# Patient Record
Sex: Male | Born: 1955
Health system: Southern US, Community
[De-identification: ages and names within clinical notes are randomized; demographics above are authoritative.]

## PROBLEM LIST (undated history)

## (undated) DIAGNOSIS — R569 Unspecified convulsions: Secondary | ICD-10-CM

## (undated) DIAGNOSIS — I10 Essential (primary) hypertension: Secondary | ICD-10-CM

## (undated) DIAGNOSIS — Z720 Tobacco use: Secondary | ICD-10-CM

## (undated) DIAGNOSIS — C3492 Malignant neoplasm of unspecified part of left bronchus or lung: Principal | ICD-10-CM

## (undated) DIAGNOSIS — D170 Benign lipomatous neoplasm of skin and subcutaneous tissue of head, face and neck: Secondary | ICD-10-CM

## (undated) HISTORY — DX: Malignant neoplasm of unspecified part of left bronchus or lung: C34.92

## (undated) HISTORY — DX: Benign lipomatous neoplasm of skin and subcutaneous tissue of head, face and neck: D17.0

## (undated) HISTORY — PX: NO PAST SURGERIES: SHX2092

## (undated) HISTORY — DX: Tobacco use: Z72.0

---

## 2002-12-31 ENCOUNTER — Emergency Department (HOSPITAL_COMMUNITY): Admission: EM | Admit: 2002-12-31 | Discharge: 2002-12-31 | Payer: Self-pay

## 2003-02-02 ENCOUNTER — Emergency Department (HOSPITAL_COMMUNITY): Admission: EM | Admit: 2003-02-02 | Discharge: 2003-02-02 | Payer: Self-pay | Admitting: Emergency Medicine

## 2003-04-22 ENCOUNTER — Emergency Department (HOSPITAL_COMMUNITY): Admission: EM | Admit: 2003-04-22 | Discharge: 2003-04-23 | Payer: Self-pay | Admitting: Emergency Medicine

## 2009-01-19 ENCOUNTER — Emergency Department (HOSPITAL_COMMUNITY): Admission: EM | Admit: 2009-01-19 | Discharge: 2009-01-19 | Payer: Self-pay | Admitting: Family Medicine

## 2010-10-03 LAB — POCT I-STAT, CHEM 8
Calcium, Ion: 1.08 mmol/L — ABNORMAL LOW (ref 1.12–1.32)
Chloride: 108 mEq/L (ref 96–112)
HCT: 49 % (ref 39.0–52.0)
Hemoglobin: 16.7 g/dL (ref 13.0–17.0)
Potassium: 4.1 mEq/L (ref 3.5–5.1)

## 2010-10-03 LAB — CK: Total CK: 347 U/L — ABNORMAL HIGH (ref 7–232)

## 2012-01-28 ENCOUNTER — Emergency Department (HOSPITAL_COMMUNITY)
Admission: EM | Admit: 2012-01-28 | Discharge: 2012-01-28 | Disposition: A | Discharge status: Home / Self Care | Payer: Self-pay | Attending: Emergency Medicine | Admitting: Emergency Medicine

## 2012-01-28 ENCOUNTER — Encounter (HOSPITAL_COMMUNITY): Payer: Self-pay | Admitting: Emergency Medicine

## 2012-01-28 DIAGNOSIS — F17200 Nicotine dependence, unspecified, uncomplicated: Secondary | ICD-10-CM | POA: Insufficient documentation

## 2012-01-28 DIAGNOSIS — T63461A Toxic effect of venom of wasps, accidental (unintentional), initial encounter: Secondary | ICD-10-CM | POA: Insufficient documentation

## 2012-01-28 DIAGNOSIS — T7840XA Allergy, unspecified, initial encounter: Secondary | ICD-10-CM

## 2012-01-28 DIAGNOSIS — T6391XA Toxic effect of contact with unspecified venomous animal, accidental (unintentional), initial encounter: Secondary | ICD-10-CM | POA: Insufficient documentation

## 2012-01-28 MED ORDER — METHYLPREDNISOLONE SODIUM SUCC 125 MG IJ SOLR
125.0000 mg | Freq: Once | INTRAMUSCULAR | Status: AC
  Administered 2012-01-28: 125 mg via INTRAVENOUS
  Filled 2012-01-28: qty 2

## 2012-01-28 MED ORDER — FAMOTIDINE IN NACL 20-0.9 MG/50ML-% IV SOLN
20.0000 mg | Freq: Once | INTRAVENOUS | Status: AC
  Administered 2012-01-28: 20 mg via INTRAVENOUS
  Filled 2012-01-28: qty 50

## 2012-01-28 NOTE — ED Notes (Signed)
Per EMS: Was stung on the lip by a bee one time. No hxt of meds or allergies. Onset of n/v within of intal sting. No resp distress.  Initial SpO2 87%. Breath sounds clear. 25mg  Benadryl IM given by PETAR.  25mg   Benadryl PO in route by [facility]. Pt. Is Spanish speaking. Family at bedside who speak [place].

## 2012-01-28 NOTE — ED Provider Notes (Signed)
History     CSN: [id number]  Arrival date & time 01/28/12  1955   First MD Initiated Contact with Patient 01/28/12 2002      No chief complaint on file.   (Consider location/radiation/quality/duration/timing/severity/associated sxs/prior treatment) HPI Comments: Patient presents today after he was stung by a bee on the lip about 2-3 hours prior to arrival in the ED.  He reports initially he felt pain, but then he became short of breath and broke out in an itchy rash on his abdomen.   He also had a couple episodes of vomiting after the bee sting.  He was given 25 mg Benadryl IM by PETAR initially and then given 25mg  PO by EMS prior to arrival in the ED.  He reports that his symptoms then improved.  He denies any SOB at this time. He denies swelling of his lips, tongue, or throat.  No prior allergic reaction to bees.    Patient is a 56 y.o. male presenting with allergic reaction. The history is provided by the patient. The history is limited by a language barrier. A language interpreter was used.  Allergic Reaction The primary symptoms are  shortness of breath, nausea, vomiting, rash and urticaria. The primary symptoms do not include wheezing or dizziness.    History reviewed. No pertinent past medical history.  History reviewed. No pertinent past surgical history.  History reviewed. No pertinent family history.  History  Substance Use Topics  . Smoking status: Current Everyday Smoker -- 1.0 packs/day for 50 years  . Smokeless tobacco: Not on file  . Alcohol Use: 7.2 oz/week    12 Cans of beer per week      Review of Systems  Constitutional: Negative for fever and chills.  HENT: Negative for facial swelling, trouble swallowing and voice change.   Respiratory: Positive for chest tightness and shortness of breath. Negative for wheezing.   Cardiovascular: Negative for chest pain.  Gastrointestinal: Positive for nausea and vomiting.  Skin: Positive for rash.  Neurological:  Negative for dizziness, syncope and light-headedness.    Allergies  Review of patient's allergies indicates no known allergies.  Home Medications  No current outpatient prescriptions on file.  BP 154/75  Pulse 89  Temp 97.4 F (36.3 C) (Oral)  Resp 20  SpO2 100%  Physical Exam  Nursing note and vitals reviewed. Constitutional: He is oriented to person, place, and time. He appears well-developed and well-nourished. No distress.  HENT:  Head: Normocephalic and atraumatic.  Mouth/Throat: Oropharynx is clear and moist and mucous membranes are normal.       No sign of airway obstruction. No edema of face, eyelids, lips, tongue, uvula.Marland Kitchen Uvula midline, no nasal congestion or drooling.  Tongue not elevated. No trismus.  Neck: Trachea normal, normal range of motion and full passive range of motion without pain. Neck supple. No tracheal deviation present.  Cardiovascular: Normal rate, regular rhythm, intact distal pulses and normal pulses.        Not tachycardic  Pulmonary/Chest: Effort normal. No stridor.  Musculoskeletal: Normal range of motion.  Neurological: He is alert and oriented to person, place, and time.  Skin: Skin is warm and intact. He is not diaphoretic.       No rash at this time.  Psychiatric: He has a normal mood and affect. His behavior is normal.    ED Course  Procedures (including critical care time)  Labs Reviewed - No data to display No results found.   No diagnosis found.  MDM  Patient re-evaluated prior to dc, is hemodynamically stable, in no respiratory distress, and denies the feeling of throat closing. Pt has been advised to take OTC benadryl & return to the ED if they have a mod-severe allergic rxn (s/s including throat closing, difficulty breathing, swelling of lips face or tongue). Pt is to follow up with their PCP. Pt is agreeable with plan & verbalizes understanding.        Pascal Lux [place], PA-C 01/29/12 1247

## 2012-01-29 NOTE — ED Provider Notes (Signed)
Medical screening examination/treatment/procedure(s) were conducted as a shared visit with non-physician practitioner(s) and myself.  I personally evaluated the patient during the encounter   David Goswick, MD 01/29/12 1505 

## 2013-12-09 ENCOUNTER — Emergency Department (HOSPITAL_COMMUNITY)
Admission: EM | Admit: 2013-12-09 | Discharge: 2013-12-09 | Discharge status: Against Medical Advice | Payer: Self-pay | Attending: Emergency Medicine | Admitting: Emergency Medicine

## 2013-12-09 ENCOUNTER — Encounter (HOSPITAL_COMMUNITY): Payer: Self-pay | Admitting: Emergency Medicine

## 2013-12-09 DIAGNOSIS — R221 Localized swelling, mass and lump, neck: Principal | ICD-10-CM

## 2013-12-09 DIAGNOSIS — F17200 Nicotine dependence, unspecified, uncomplicated: Secondary | ICD-10-CM | POA: Insufficient documentation

## 2013-12-09 DIAGNOSIS — M542 Cervicalgia: Secondary | ICD-10-CM | POA: Insufficient documentation

## 2013-12-09 DIAGNOSIS — R220 Localized swelling, mass and lump, head: Secondary | ICD-10-CM | POA: Insufficient documentation

## 2013-12-09 NOTE — ED Notes (Signed)
Patient approached desk and announced that he was leaving. Declined to speak with nurse as requested by registration personnel.

## 2013-12-09 NOTE — ED Notes (Signed)
Pt. reports lump at back of lower neck onset December last year increasing in size with intermittent pain , denies injury , no drainage .

## 2014-01-31 ENCOUNTER — Encounter (HOSPITAL_COMMUNITY): Payer: Self-pay | Admitting: Emergency Medicine

## 2014-01-31 ENCOUNTER — Emergency Department (HOSPITAL_COMMUNITY): Payer: Self-pay

## 2014-01-31 ENCOUNTER — Emergency Department (HOSPITAL_COMMUNITY)
Admission: EM | Admit: 2014-01-31 | Discharge: 2014-01-31 | Disposition: A | Discharge status: Home / Self Care | Payer: Self-pay | Attending: Emergency Medicine | Admitting: Emergency Medicine

## 2014-01-31 DIAGNOSIS — D1779 Benign lipomatous neoplasm of other sites: Secondary | ICD-10-CM | POA: Insufficient documentation

## 2014-01-31 DIAGNOSIS — F17200 Nicotine dependence, unspecified, uncomplicated: Secondary | ICD-10-CM | POA: Insufficient documentation

## 2014-01-31 DIAGNOSIS — M542 Cervicalgia: Secondary | ICD-10-CM | POA: Insufficient documentation

## 2014-01-31 DIAGNOSIS — D179 Benign lipomatous neoplasm, unspecified: Secondary | ICD-10-CM

## 2014-01-31 LAB — CBC WITH DIFFERENTIAL/PLATELET
Basophils Absolute: 0 10*3/uL (ref 0.0–0.1)
Basophils Relative: 0 % (ref 0–1)
Eosinophils Absolute: 0.1 10*3/uL (ref 0.0–0.7)
Eosinophils Relative: 1 % (ref 0–5)
HCT: 46.3 % (ref 39.0–52.0)
Hemoglobin: 16.7 g/dL (ref 13.0–17.0)
Lymphocytes Relative: 14 % (ref 12–46)
Lymphs Abs: 1.2 10*3/uL (ref 0.7–4.0)
MCH: 33.9 pg (ref 26.0–34.0)
MCHC: 36.1 g/dL — ABNORMAL HIGH (ref 30.0–36.0)
MCV: 93.9 fL (ref 78.0–100.0)
Monocytes Absolute: 1.1 10*3/uL — ABNORMAL HIGH (ref 0.1–1.0)
Monocytes Relative: 13 % — ABNORMAL HIGH (ref 3–12)
Neutro Abs: 6.1 10*3/uL (ref 1.7–7.7)
Neutrophils Relative %: 72 % (ref 43–77)
Platelets: 295 10*3/uL (ref 150–400)
RBC: 4.93 MIL/uL (ref 4.22–5.81)
RDW: 12.2 % (ref 11.5–15.5)
WBC: 8.5 10*3/uL (ref 4.0–10.5)

## 2014-01-31 LAB — BASIC METABOLIC PANEL
Anion gap: 16 — ABNORMAL HIGH (ref 5–15)
BUN: 4 mg/dL — ABNORMAL LOW (ref 6–23)
CO2: 22 mEq/L (ref 19–32)
Calcium: 9.3 mg/dL (ref 8.4–10.5)
Chloride: 97 mEq/L (ref 96–112)
Creatinine, Ser: 0.48 mg/dL — ABNORMAL LOW (ref 0.50–1.35)
GFR calc Af Amer: >90 mL/min (ref >90)
GFR calc non Af Amer: >90 mL/min (ref >90)
Glucose, Bld: 92 mg/dL (ref 70–99)
Potassium: 4.2 mEq/L (ref 3.7–5.3)
Sodium: 135 mEq/L — ABNORMAL LOW (ref 137–147)

## 2014-01-31 MED ORDER — SODIUM CHLORIDE 0.9 % IV SOLN
Freq: Once | INTRAVENOUS | Status: AC
  Administered 2014-01-31: 13:00:00 via INTRAVENOUS

## 2014-01-31 MED ORDER — IOHEXOL 300 MG/ML  SOLN
100.0000 mL | Freq: Once | INTRAMUSCULAR | Status: AC | PRN
  Administered 2014-01-31: 100 mL via INTRAVENOUS

## 2014-01-31 NOTE — ED Notes (Addendum)
Pt reports having a growth on the back of his neck since December, which has progressively gotten worse. Pt also reports having another area, which is smaller on the left side of the anterior neck. Pt reports the pain has worsened over the past week. Pt is hypertensive (201/84) in triage, pt denies a history of hypertension. Pt reports feeling dizzy, however denies blurred vision.

## 2014-01-31 NOTE — ED Provider Notes (Signed)
CSN: [id number]     Arrival date & time 01/31/14  1120 History   First MD Initiated Contact with Patient 01/31/14 1140     Chief Complaint  Patient presents with  . Neck Pain      HPI  Pt presents with his family with a complaint of a mass on the back of his neck, and on his left neck. He been present since December. Family feels they're getting bigger. He denies any trouble swallowing or breathing. No pain with movement of his. They're soft. Cannot read. They're not draining. No injuries to the area. No additional symptoms. No swelling in his groin, axilla. No additional areas of swelling  History reviewed. No pertinent past medical history. History reviewed. No pertinent past surgical history. No family history on file. History  Substance Use Topics  . Smoking status: Current Every Day Smoker -- 1.00 packs/day for 35 years    Types: Cigarettes  . Smokeless tobacco: Never Used  . Alcohol Use: Yes    Review of Systems  Constitutional: Negative for fever, chills, diaphoresis, appetite change and fatigue.  HENT: Negative for mouth sores, sore throat and trouble swallowing.        Swelling in the posterior neck and lateral neck  Eyes: Negative for visual disturbance.  Respiratory: Negative for cough, chest tightness, shortness of breath and wheezing.   Cardiovascular: Negative for chest pain.  Gastrointestinal: Negative for nausea, vomiting, abdominal pain, diarrhea and abdominal distention.  Endocrine: Negative for polydipsia, polyphagia and polyuria.  Genitourinary: Negative for dysuria, frequency and hematuria.  Musculoskeletal: Negative for gait problem.  Skin: Negative for color change, pallor and rash.  Neurological: Negative for dizziness, syncope, light-headedness and headaches.  Hematological: Does not bruise/bleed easily.  Psychiatric/Behavioral: Negative for behavioral problems and confusion.      Allergies  Bee venom  Home Medications   Prior to Admission  medications   Not on File   BP 193/70  Pulse 89  Temp(Src) 98.2 F (36.8 C) (Oral)  Resp 18  SpO2 94% Physical Exam  Constitutional: He is oriented to person, place, and time. He appears well-developed and well-nourished. No distress.  HENT:  Head: Normocephalic.  Eyes: Conjunctivae are normal. Pupils are equal, round, and reactive to light. No scleral icterus.  Neck: Normal range of motion. Neck supple. No thyromegaly present.    Cardiovascular: Normal rate and regular rhythm.  Exam reveals no gallop and no friction rub.   No murmur heard. Pulmonary/Chest: Effort normal and breath sounds normal. No respiratory distress. He has no wheezes. He has no rales.  Abdominal: Soft. Bowel sounds are normal. He exhibits no distension. There is no tenderness. There is no rebound.  Musculoskeletal: Normal range of motion.  Neurological: He is alert and oriented to person, place, and time.  Skin: Skin is warm and dry. No rash noted.  Psychiatric: He has a normal mood and affect. His behavior is normal.    ED Course  Procedures (including critical care time) Labs Review Labs Reviewed  BASIC METABOLIC PANEL - Abnormal; Notable for the following:    Sodium 135 (*)    BUN 4 (*)    Creatinine, Ser 0.48 (*)    Anion gap 16 (*)    All other components within normal limits  CBC WITH DIFFERENTIAL - Abnormal; Notable for the following:    MCHC 36.1 (*)    Monocytes Relative 13 (*)    Monocytes Absolute 1.1 (*)    All other components within normal limits  Imaging Review Ct Soft Tissue Neck W Contrast  01/31/2014   CLINICAL DATA:  Posterior and left lateral neck mass/swelling since 05/2013. Increasing posterior neck pain for the past week.  EXAM: CT NECK WITH CONTRAST  TECHNIQUE: Multidetector CT imaging of the neck was performed using the standard protocol following the bolus administration of intravenous contrast.  CONTRAST:  13mL OMNIPAQUE IOHEXOL 300 MG/ML  SOLN  COMPARISON:  None.   FINDINGS: The visualized portion of brain is unremarkable. Visualized paranasal sinuses and mastoid air cells are clear. The nasopharynx, oropharynx, oral cavity, and larynx are unremarkable. 1.1 cm low-density left thyroid nodule is incidentally noted. The submandibular glands are unremarkable. Punctate calcifications are noted in the parotid glands.  There is mild subcutaneous fat stranding throughout the posterior neck. Immediately deep to a skin marker placed on the posterior neck is a more circumscribed focus of fat density measuring 3.9 x 2.2 cm, likely representing a lipoma. No fluid collection or enhancing mass is identified.  No enlarged lymph nodes are identified in the neck. Moderate atherosclerotic vascular calcification is noted of the common carotid arteries aortic arch/arch vessel origins. There is a 10 mm nodule in the apical left upper lobe. A 3 mm nodule is present in the apical right upper lobe. Moderate disc space narrowing and prominent bridging osteophytes are present in the mid and lower cervical spine.  IMPRESSION: Mild, diffuse subcutaneous fat stranding/ edema throughout the posterior neck without fluid collection or enhancing mass. 3.9 cm oval fat density structure in the posterior neck is compatible with a lipoma.   Electronically Signed   By: Logan Bores   On: 01/31/2014 14:05     EKG Interpretation None      MDM   Final diagnoses:  Lipoma    ENT follow up if consideration for elective resection.    Tanna Furry, MD 01/31/14 1450

## 2014-01-31 NOTE — Discharge Instructions (Signed)
Lipoma (Lipoma) Un lipoma es un tumor benigno (no canceroso) compuesto por clulas adiposas. Generalmente se encuentran debajo de la piel (subcutneos). Puede producirse en cualquier tejido del cuerpo que contenga grasa. Las zonas ms frecuentes en las que aparecen son la espalda, los hombros, las nalgas y los muslos.Los lipomas son un crecimiento comn en los tejidos blandos. Son blandos y de crecimiento lento. La mayor parte de los problemas que ocasiona un lipoma dependen de la ubicacin del tumor (bulto) de tejido adiposo. DIAGNSTICO Generalmente el diagnstico se realiza a travs del examen fsico. Aunque estos tumores rara vez se vuelven cancerosos, las radiografas podrn determinar de qu tipo se trata. Los estudios que se utilizan incluyen:  Tomografas computadas.  Resonancia magntica (RMN). TRATAMIENTO Los lipomas pequeos, que no causan problemas, pueden observarse. Si un lipoma se agranda mucho o causa problemas, el mejor tratamiento ser la extirpacin. Tambin se extirpan con fines estticos. En la [place] se extirpan las clulas de grasa y la cpsula que las rodea. Generalmente se lleva a cabo aplicando medicamentos que adormecen la zona (anestsico local). El tejido extrado es examinado en el microscopio para asegurarse que no es canceroso. Cumpla con las citas tal como se le indic. SOLICITE ATENCIN MDICA SI:  El lipoma se agranda o se hace ms duro.  Se vuelve rojo, se hincha cada vez ms o duele. Podran ser signos de infeccin o de un problema ms grave. Document Released: 03/23/2005 Document Revised: 09/05/2011 [facility] Patient Information 2015 [place]. This information is not intended to replace advice given to you by your health care provider. Make sure you discuss any questions you have with your health care provider.

## 2014-11-25 ENCOUNTER — Emergency Department (HOSPITAL_COMMUNITY)
Admission: EM | Admit: 2014-11-25 | Discharge: 2014-11-25 | Disposition: A | Discharge status: Home / Self Care | Payer: Self-pay | Attending: Emergency Medicine | Admitting: Emergency Medicine

## 2014-11-25 ENCOUNTER — Encounter (HOSPITAL_COMMUNITY): Payer: Self-pay | Admitting: Emergency Medicine

## 2014-11-25 ENCOUNTER — Emergency Department (HOSPITAL_COMMUNITY): Payer: Self-pay

## 2014-11-25 DIAGNOSIS — S52692A Other fracture of lower end of left ulna, initial encounter for closed fracture: Secondary | ICD-10-CM | POA: Insufficient documentation

## 2014-11-25 DIAGNOSIS — Y9289 Other specified places as the place of occurrence of the external cause: Secondary | ICD-10-CM | POA: Insufficient documentation

## 2014-11-25 DIAGNOSIS — W1839XA Other fall on same level, initial encounter: Secondary | ICD-10-CM | POA: Insufficient documentation

## 2014-11-25 DIAGNOSIS — S52202A Unspecified fracture of shaft of left ulna, initial encounter for closed fracture: Secondary | ICD-10-CM

## 2014-11-25 DIAGNOSIS — Z79899 Other long term (current) drug therapy: Secondary | ICD-10-CM | POA: Insufficient documentation

## 2014-11-25 DIAGNOSIS — Z720 Tobacco use: Secondary | ICD-10-CM | POA: Insufficient documentation

## 2014-11-25 DIAGNOSIS — Y998 Other external cause status: Secondary | ICD-10-CM | POA: Insufficient documentation

## 2014-11-25 DIAGNOSIS — Y9389 Activity, other specified: Secondary | ICD-10-CM | POA: Insufficient documentation

## 2014-11-25 DIAGNOSIS — S52592A Other fractures of lower end of left radius, initial encounter for closed fracture: Secondary | ICD-10-CM | POA: Insufficient documentation

## 2014-11-25 DIAGNOSIS — S52502A Unspecified fracture of the lower end of left radius, initial encounter for closed fracture: Secondary | ICD-10-CM

## 2014-11-25 DIAGNOSIS — I10 Essential (primary) hypertension: Secondary | ICD-10-CM | POA: Insufficient documentation

## 2014-11-25 HISTORY — DX: Essential (primary) hypertension: I10

## 2014-11-25 MED ORDER — HYDROCODONE-ACETAMINOPHEN 5-325 MG PO TABS
1.0000 | ORAL_TABLET | Freq: Four times a day (QID) | ORAL | Status: DC | PRN
Start: 2014-11-25 — End: 2015-10-23

## 2014-11-25 MED ORDER — HYDROCODONE-ACETAMINOPHEN 5-325 MG PO TABS: 1.0000 | ORAL_TABLET | Freq: Once | ORAL | Status: DC

## 2014-11-25 NOTE — Discharge Instructions (Signed)
Return to the emergency room with worsening of symptoms, new symptoms or with symptoms that are concerning, especially warmth, redness, worsening swelling, numbness, tingling, weakness. RICE: Rest, Ice (three cycles of 20 mins on, 58mns off at least twice a day), compression/brace, elevation. Heating pad works well for back pain. Ibuprofen '400mg'$  (2 tablets '200mg'$ ) every 5-6 hours for 3-5 days. norco for severe pain. Do not operate machinery, drive or drink alcohol while taking narcotics or muscle relaxers. Call to make follow up appointment with orthopedics as soon as possible.  Cuidados del yeso o la frula (Cast or Splint Care) El yeso y las frulas sostienen los miembros lesionados y evitan que los huesos se muevan hasta que se curen. Es importante que cuide el yeso o la frula cuando se encuentre en su casa.  INSTRUCCIONES PARA EL CUIDADO EN EL HOGAR  Mantenga el yeso o la frula al descubierto durante el tiempo de secado. Puede tardar eLyndal Pulley24 y 415horas para secarse si est hecho de yeso. La fibra de vidrio se seca en menos de 1 hora.  No apoye el yeso sobre nada que sea ms duro que una almohada durante 24 horas.  No aplique peso sobre el miembro lesionado ni haga presin sobre el yeso hasta que el mdico lo autorice.  Mantenga el yeso o la frula secos. Al mojarse pueden perder la forma y podra ocurrir que no soporten el m[place] Un yeso mojado que ha perdido su forma puede presionar de mGeographical information systems officerpeligrosa en la piel al secarse. Adems, la piel mojada podra infectarse.  Cubra el yeso o la frula con una bolsa plstica cuando tome un bao o cuando salga al exterior en das de lluvia o nieve. Si el yeso est colocado sobre el tronco, deber baarse pasando una esponja por el cuerpo, hasta que se lo retiren.  Si el yeso se moja, squelo con una toalla o con un secador de cabello slo en posicin de aire fro.  Mantenga el yeso o la frula limpios. Si el yeso se ensucia, puede limpiarlo  con un pao hmedo.  No coloque objetos extraos duros o blandos debajo del yeso o cabestrillo, como algodn, papel higinico, locin o talco.  No se rasque la piel por debajo del molde con ningn objeto. Podra quedar adherido al yeso. Adems, el rascado puede causar una infeccin. Si siente picazn, use un secador de cabello con aire fro sNIKEzona que pica para aFederated Department Stores  No recorte ni quite el relleno acolchado que se encuentra debajo del yeso.  Ejercite todas las articulaciones que no estn inmovilizadas por el yeso o frula. Por ejemplo, si tiene un yeso largo de pierna, ejercite la articulacin de la cadera y los dedos de los pies. Si tiene un brazo eConocoPhillipso entablillado, ejercite el hombro, el codo, el pulgar y los dedos de la [place]  Eleve el brazo o la pierna sobre 1  2 almohadas durante los primeros 3 das para disminuir la hinchazn y eConservation officer, historic buildingsEs mejor si puede elevar cmodamente el yeso para que quede ms aNew Caledoniadel nivel del corazn. SOLICITE ATENCIN MDICA SI:   El yeso o la frula se quiebran.  Siente que el yeso o la frula estn muy apretados o muy flojos.  Tiene una picazn insoportable debajo del yeso.  El yeso se moja o tiene una zona blanda.  Siente un feo oSears Holdings Corporationproviene del interior del yGalena Park  Algn objeto se queda atascado bajo el yeso.  La piel que rodea  el yeso enrojece o se vuelve sensible.  Siente un dolor nuevo o el dolor que senta empeora luego de la aplicacin del yeso. SOLICITE ATENCIN MDICA DE INMEDIATO SI:   Observa un lquido que sale por el yeso.  No puede mover el dedo lesionado.  Los dedos le cambian de color (blancos o azules), siente fro, Social research officer, government o por fuera del yeso los dedos estn muy inflamados.  Siente hormigueo o adormecimiento alrededor de la zona de la lesin.  Siente un dolor o presin intensos debajo del yeso.  Presenta dificultad para respirar o Risk manager.  Siente dolor en el pecho. Document  Released: 06/13/2005 Document Revised: 04/03/2013 [facility] Patient Information 2015 [place], [place]. This information is not intended to replace advice given to you by your health care provider. Make sure you discuss any questions you have with your health care provider. Fractura de mueca (Wrist Fracture) La fractura de mueca es la rotura de uno de los huesos que la forman. La mueca est formada por ocho huesos pequeos en la palma de la mano (huesos carpianos) y [employer] largos que componen el Management consultant (radio) y (cbito).  CAUSAS   Un golpe directo en la mueca.  Cada sobre una mano extendida.  Traumatismos, como un accidente automovilstico o una cada. FACTORES DE RIESGO Los factores de riesgo de fractura de [place] incluyen:   Psychologist, prison and probation services deportes de contacto y de alto riesgo, como esqu, ciclismo y patinaje sobre hielo.  Tomar corticoides.  Fumar.  Ser mujer.  Ser de Science writer.  Consumir ms de tres bebidas alcohlicas por da.  Tener baja densidad sea o disminucin de la densidad sea (osteoporosis u osteopenia).  La edad. En los [employer], la densidad sea [place].  Las [employer].  Antecedentes de fracturas previas. SIGNOS Y SNTOMAS Los sntomas de una fractura de mueca incluyen dolor con la palpacin, hematomas e hinchazn. Adems, la mueca puede colgar en una posicin extraa o verse deformada.  DIAGNSTICO El diagnstico puede incluir:  Examen fsico.  Radiografas. [place] de muchos factores, por ejemplo el tipo y la ubicacin de la [place], su edad y su nivel de [place]. El tratamiento puede ser quirrgico o no quirrgico.  Tratamiento no quirrgico Le colocarn un yeso o una frula en la [place], si el hueso est en una posicin correcta. Si la fractura no est en una posicin correcta, tal vez sea necesario que el mdico corrija el desplazamiento antes de poner el yeso o la frula. Generalmente, el  yeso o la frula se usarn durante varias semanas.  Tratamiento quirrgico En algunos [employer] partes del hueso estn tan fuera de Environmental consultant que es necesario recurrir a Furniture conservator/restorer ciruga para Glass blower/designer un dispositivo que las mantenga unidas mientras se [place]. Segn el tipo de fractura, hay diferentes opciones para mantener el [employer] en su lugar mientras se consolida, por ejemplo un yeso y pernos de metal.  INSTRUCCIONES PARA EL CUIDADO EN EL HOGAR  Mantenga la mueca lesionada elevada y [employer] dedos tanto como pueda.  No ejerza presin sobre cualquier parte del yeso o la frula. Podra romperse.   Use una bolsa plstica para proteger el yeso o la frula cuando tome un bao o [place]. No sumerja el yeso o la frula en el agua.  Tome los medicamentos solamente como se lo haya indicado el mdico.  Mantenga el yeso o la frula limpios y secos. Si se mojan, se daan o de repente [employer], comunquese de  inmediato con el mdico.  No consuma ningn producto que contenga tabaco, como cigarrillos, tabaco de Higher education careers adviser o Psychologist, sport and exercise. El tabaco puede retardar la consolidacin de la fractura. Si necesita ayuda para dejar de fumar, consulte al mdico.  Concurra a todas las visitas de control como se lo haya indicado el mdico. Esto es importante.  Pregntele al mdico si debe tomar suplementos de calcio y vitaminasC yD para promover la consolidacin de Electrical engineer. SOLICITE ATENCIN MDICA SI:   El yeso o la frula se daan, se rompen o se mojan.  Tiene fiebre.  Tiene escalofros.  Siente un dolor fuerte y continuo o est ms hinchado que antes de que le colocaran el yeso. SOLICITE ATENCIN MDICA DE INMEDIATO SI:   [place] uas de la mano del brazo lesionado se tornan azules o grises, o estn fras o adormecidas.  Pierde la sensibilidad en los dedos de la mano del brazo lesionado. ASEGRESE DE QUE:  Comprende estas instrucciones.  Controlar su afeccin.  Recibir  ayuda de inmediato si no mejora o si empeora. Document Released: 03/23/2005 Document Revised: 10/28/2013 [facility] Patient Information 2015 [place]. This information is not intended to replace advice given to you by your health care provider. Make sure you discuss any questions you have with your health care provider.

## 2014-11-25 NOTE — ED Provider Notes (Signed)
CSN: [id number]     Arrival date & time 11/25/14  1636 History  This chart was scribed for a non-physician practitioner, Al Corpus, PA-C working with Lacretia Leigh, MD by Randa Evens, ED Scribe. This patient was seen in room WTR8/WTR8 and the patient's care was started at 6:29 PM.    Chief Complaint  Patient presents with  . Hand Pain   The history is provided by the patient. No language interpreter was used.   HPI Comments: David Warren is a 59 y.o. male who presents to the Emergency Department complaining of improving left hand pain onset 1 week prior after falling. Pt states that he has associated swelling and tingling in the 1st and 2nd digits. Pt states that he fell on to his outstretched hand. Pt has tried tylenol and Advil that provides temporary relief. Denies fever, chills, nausea or vomiting.     Past Medical History  Diagnosis Date  . Hypertension    History reviewed. No pertinent past surgical history. No family history on file. History  Substance Use Topics  . Smoking status: Current Every Day Smoker -- 1.00 packs/day for 50 years  . Smokeless tobacco: Not on file  . Alcohol Use: 7.2 oz/week    12 Cans of beer per week    Review of Systems  Constitutional: Negative for fever and chills.  Gastrointestinal: Negative for nausea and vomiting.  Musculoskeletal: Positive for joint swelling and arthralgias.     Allergies  Review of patient's allergies indicates no known allergies.  Home Medications   Prior to Admission medications   Medication Sig Start Date End Date Taking? Authorizing Provider  acetaminophen (TYLENOL) 500 MG tablet Take 1,000 mg by mouth every 6 (six) hours as needed for moderate pain (pain).   Yes Historical Provider, MD  ibuprofen (ADVIL,MOTRIN) 200 MG tablet Take 400 mg by mouth every 6 (six) hours as needed (pain).   Yes Historical Provider, MD  Pseudoephedrine-Ibuprofen 30-200 MG CAPS Take 400 mg by mouth daily as needed (pain).   Yes  Historical Provider, MD  HYDROcodone-acetaminophen (NORCO/VICODIN) 5-325 MG per tablet Take 1 tablet by mouth every 6 (six) hours as needed. 11/25/14   Al Corpus, PA-C   BP 159/70 mmHg  Pulse 80  Temp(Src) 98 F (36.7 C) (Oral)  Resp 18  SpO2 99%   Physical Exam  Constitutional: He appears well-developed and well-nourished. No distress.  HENT:  Head: Normocephalic and atraumatic.  Eyes: Conjunctivae are normal. Right eye exhibits no discharge. Left eye exhibits no discharge.  Pulmonary/Chest: Effort normal. No respiratory distress.  Musculoskeletal: He exhibits tenderness.  Obvious deformity to left wrist with swelling, pain with supination and pronation, full ROM of wrist without significant pain. Strength intact, 2 + radial pulses equal bilaterally, sensation intact, no tenting of skin.  Neurological: He is alert. Coordination normal.  Skin: He is not diaphoretic.  Psychiatric: He has a normal mood and affect. His behavior is normal.  Nursing note and vitals reviewed.   ED Course  Procedures (including critical care time) DIAGNOSTIC STUDIES: Oxygen Saturation is 99% on RA, normal by my interpretation.    COORDINATION OF CARE: 7:00 PM-Discussed treatment plan with pt at bedside and pt agreed to plan.     Labs Review Labs Reviewed - No data to display  Imaging Review No results found.   DG Hand Complete Left (Final result) Result time: 11/25/14 17:56:04   Final result by Rad Results In Interface (11/25/14 17:56:04)   Narrative:   CLINICAL DATA:  Tripped and fell landing on hand with pain, initial encounter  EXAM: LEFT HAND - COMPLETE 3+ VIEW  COMPARISON: None.  FINDINGS: Transverse fracture of the distal radius is noted. An avulsion fracture of the ulnar styloid is seen as well. Some impaction and posterior angulation at the fracture site is noted. The carpal bones and metacarpals are within normal limits. No other focal abnormality is  seen.  IMPRESSION: Distal radial and ulnar fractures as described.   Electronically Signed By: Inez Catalina M.D. On: 11/25/2014 17:56      EKG Interpretation None        MDM   Final diagnoses:  Distal radial fracture, left, closed, initial encounter  Left ulnar fracture, closed, initial encounter   Pt with left distal and radial fractures as above with obvious wrist deformity. Injury over one week ago. Neurovascularly intact. No indication for acute intervention. Pt placed in a sugar tong splint and to follow up with orthopedist. Pt pain managed in ED. Script for norco provided. Driving and sedation precautions provided.   Discussed return precautions with patient. Discussed all results and patient verbalizes understanding and agrees with plan.  Case has been discussed with Dr. Zenia Resides who agrees with the above plan and to discharge.   I personally performed the services described in this documentation, which was scribed in my presence. The recorded information has been reviewed and is accurate.    Al Corpus, PA-C 11/28/14 4081  Lacretia Leigh, MD 12/02/14 1140

## 2014-11-25 NOTE — ED Notes (Signed)
Per family fell lasrt Wed on left hand, having swelling and pain

## 2015-10-20 ENCOUNTER — Emergency Department (HOSPITAL_COMMUNITY): Payer: Self-pay

## 2015-10-20 ENCOUNTER — Encounter (HOSPITAL_COMMUNITY): Payer: Self-pay | Admitting: *Deleted

## 2015-10-20 ENCOUNTER — Emergency Department (HOSPITAL_COMMUNITY)
Admission: EM | Admit: 2015-10-20 | Discharge: 2015-10-20 | Disposition: A | Discharge status: Home / Self Care | Payer: Self-pay | Attending: Emergency Medicine | Admitting: Emergency Medicine

## 2015-10-20 DIAGNOSIS — F17200 Nicotine dependence, unspecified, uncomplicated: Secondary | ICD-10-CM | POA: Insufficient documentation

## 2015-10-20 DIAGNOSIS — R042 Hemoptysis: Secondary | ICD-10-CM | POA: Insufficient documentation

## 2015-10-20 DIAGNOSIS — R221 Localized swelling, mass and lump, neck: Secondary | ICD-10-CM | POA: Insufficient documentation

## 2015-10-20 DIAGNOSIS — I10 Essential (primary) hypertension: Secondary | ICD-10-CM | POA: Insufficient documentation

## 2015-10-20 LAB — COMPREHENSIVE METABOLIC PANEL
ALT: 49 U/L (ref 17–63)
ANION GAP: 13 (ref 5–15)
AST: 86 U/L — ABNORMAL HIGH (ref 15–41)
Albumin: 4.4 g/dL (ref 3.5–5.0)
Alkaline Phosphatase: 157 U/L — ABNORMAL HIGH (ref 38–126)
CHLORIDE: 93 mmol/L — AB (ref 101–111)
CO2: 22 mmol/L (ref 22–32)
Calcium: 9.4 mg/dL (ref 8.9–10.3)
Creatinine, Ser: 0.44 mg/dL — ABNORMAL LOW (ref 0.61–1.24)
Glucose, Bld: 86 mg/dL (ref 65–99)
POTASSIUM: 3.7 mmol/L (ref 3.5–5.1)
Sodium: 128 mmol/L — ABNORMAL LOW (ref 135–145)
TOTAL PROTEIN: 7.7 g/dL (ref 6.5–8.1)
Total Bilirubin: 1 mg/dL (ref 0.3–1.2)

## 2015-10-20 LAB — TYPE AND SCREEN
ABO/RH(D): AB POS
ANTIBODY SCREEN: NEGATIVE

## 2015-10-20 LAB — PROTIME-INR
INR: 0.92 (ref 0.00–1.49)
Prothrombin Time: 12.6 seconds (ref 11.6–15.2)

## 2015-10-20 LAB — CBC
HEMATOCRIT: 44.6 % (ref 39.0–52.0)
HEMOGLOBIN: 16.5 g/dL (ref 13.0–17.0)
MCH: 34.7 pg — ABNORMAL HIGH (ref 26.0–34.0)
MCHC: 37 g/dL — ABNORMAL HIGH (ref 30.0–36.0)
MCV: 93.9 fL (ref 78.0–100.0)
Platelets: 300 10*3/uL (ref 150–400)
RBC: 4.75 MIL/uL (ref 4.22–5.81)
RDW: 12.2 % (ref 11.5–15.5)
WBC: 10.8 10*3/uL — AB (ref 4.0–10.5)

## 2015-10-20 LAB — ABO/RH: ABO/RH(D): AB POS

## 2015-10-20 MED ORDER — IOPAMIDOL (ISOVUE-300) INJECTION 61%
75.0000 mL | Freq: Once | INTRAVENOUS | Status: AC | PRN
  Administered 2015-10-20: 75 mL via INTRAVENOUS

## 2015-10-20 MED ORDER — SODIUM CHLORIDE 0.9 % IV BOLUS (SEPSIS)
500.0000 mL | Freq: Once | INTRAVENOUS | Status: AC
  Administered 2015-10-20: 500 mL via INTRAVENOUS

## 2015-10-20 NOTE — Discharge Instructions (Signed)
Hemoptysis  (Hemoptysis) Hemoptisis, que significa eliminar sangre al toser, puede ser un signo de un problema menor o de una afeccin mdica grave. La sangre puede provenir de los pulmones o de las vas areas. Tambin puede provenir de un sangrado que se haya producido fuera de los pulmones o de las vas areas. La sangre puede drenar hacia la trquea durante una hemorragia nasal intensa o cuando vomita sangre desde el estmago. Debido a que la hemoptisis puede ser un signo de una afeccin grave, requiere Armed forces training and education officer. En algunas personas, nunca se identifica una causa definida de hemoptisis.  CAUSAS  La causa ms frecuente de hemoptisis es la bronquitis. Otras causas frecuentes son:   Un vaso sanguneo que se rompe debido a la tos o a una infeccin.   Una afeccin mdica que causa dao a los grandes conductos de aire (bronquiectasia).   Un cogulo de [employer] pulmones (embolia pulmonar).   Neumona.   Tuberculosis.   Aspiracin de un cuerpo extrao pequeo.   Cncer. En algunas personas, nunca se identifica una causa definida de hemoptisis.   INSTRUCCIONES PARA EL CUIDADO EN EL HOGAR   Tome slo medicamentos de venta libre o recetados, segn las indicaciones del mdico. No use antitusivos excepto que el mdico lo autorice.  Si su mdico le receta antibiticos, tmelos tal como le indic. Tmelos todos, aunque se sienta mejor.  No fume. Evite ser un fumador pasivo.  Concurra a las visitas de control, segn las indicaciones. SOLICITE ATENCIN MDICA DE INMEDIATO SI:   Elimina sangre con el moco durante ms de una semana.  Tiene una tos intensa o que [place], y en la que elimina [place].  Tiene tos con la que elimina sangre que aparece y desaparece con el tiempo.  Tiene problemas para respirar.   Vomita sangre.  Las heces son sanguinolentas o de color negro.  Siente dolor en el pecho.   Tiene transpiracin nocturna.  Sufre mareos o se desmaya.    Tiene fiebre o sntomas que persisten durante ms de 2 o 3 das.  Tiene fiebre y los sntomas empeoran de manera sbita. ASEGRESE DE QUE:   Comprende estas instrucciones.  Controlar su enfermedad.  Solicitar ayuda de inmediato si no mejora o si empeora.   Esta informacin no tiene Marine scientist el consejo del mdico. Asegrese de hacerle al mdico cualquier pregunta que tenga.   Document Released: 06/13/2005 Document Revised: 05/30/2012 Elsevier Interactive Patient Education [employer].

## 2015-10-20 NOTE — ED Notes (Signed)
Patient transported to CT 

## 2015-10-20 NOTE — ED Notes (Signed)
Called for patient.  Not found in room.

## 2015-10-20 NOTE — ED Notes (Signed)
Pt's daughter reports pt has been coughing up blood and blood clots x 2 weeks.  Reports feeling a "ball" in his throat.  Pt is a smoker, smokes 1ppd.  Pt denies any cp or SOB at this time but reports pain in his throat.  He also drinks alcohol 6-7 24oz beer daily.

## 2015-10-20 NOTE — Progress Notes (Addendum)
EDCM spoke to patient's family at bedside. Patient's family  confirms patient does not have a pcp or insurance living in [place].  [facility] provided patient with pamphlet to [facility], informed patient of services there.  EDCM also provided patient with list of pcps who accept self pay patients, list of discount pharmacies and websites needymeds.org and GoodRX.com for medication assistance, phone number to inquire about the orange card, phone number to inquire about Medici[employer], phone number to inquire about the [place], financial resources in the community such as local churches, salvation army, urban ministries, and dental assistance for uninsured patients.  Patient's family thankful for resources.  No further EDCM needs at this time.    Follow up appointment made for patient at [facility] for Oct 26 2015 at 330pm.  This information was placed on patient's AVS.  EDCM also provided patient's daughter with written information regarding appointment.  Patient's family thankful for services.  Discussed with EDRN.  No further EDCm needs at this time.

## 2015-10-20 NOTE — ED Provider Notes (Signed)
CSN: [id number]     Arrival date & time 10/20/15  1133 History   First MD Initiated Contact with Patient 10/20/15 1523     Chief Complaint  Patient presents with  . Hemoptysis     The history is provided by the patient. No language interpreter was used.   Eugine Bubb is a 60 y.o. male who presents to the Emergency Department complaining of hemoptysis.  History is provided by the patient and his daughter. The last 2 weeks he's had increased congestion. A week ago he had a large amount of hemoptysis with clots mixed in. Since that time he's been coughing up phlegm mixed with small amounts of blood. As that have been going on his neck since 2012. He felt like one of them on the front of his neck was very large and now it feels much smaller than before.  No fever, chest pain, shortness of breath. He hasn't anterior throat pain earlier that has since resolved. He is a smoker and a heavy drinker. Sxs are moderate, constant, waxing and waning.    Past Medical History  Diagnosis Date  . Hypertension    History reviewed. No pertinent past surgical history. No family history on file. Social History  Substance Use Topics  . Smoking status: Current Every Day Smoker -- 1.00 packs/day for 50 years  . Smokeless tobacco: None  . Alcohol Use: 7.2 oz/week    12 Cans of beer per week    Review of Systems  All other systems reviewed and are negative.     Allergies  Review of patient's allergies indicates no known allergies.  Home Medications   Prior to Admission medications   Medication Sig Start Date End Date Taking? Authorizing Provider  ibuprofen (ADVIL,MOTRIN) 200 MG tablet Take 400 mg by mouth every 6 (six) hours as needed (pain).   Yes Historical Provider, MD  HYDROcodone-acetaminophen (NORCO/VICODIN) 5-325 MG per tablet Take 1 tablet by mouth every 6 (six) hours as needed. Patient not taking: Reported on 10/20/2015 11/25/14   Al Corpus, PA-C   BP 176/87 mmHg  Pulse 87  Temp(Src)  97.7 F (36.5 C) (Oral)  Resp 16  Ht '5\' 3"'$  (1.6 m)  Wt 132 lb (59.875 kg)  BMI 23.39 kg/m2  SpO2 99% Physical Exam  Constitutional: He is oriented to person, place, and time. He appears well-developed and well-nourished.  HENT:  Head: Normocephalic and atraumatic.  Mouth/Throat: Oropharynx is clear and moist.  Neck:  Multiple soft masses around neck  Cardiovascular: Normal rate and regular rhythm.   No murmur heard. Pulmonary/Chest: Effort normal and breath sounds normal. No respiratory distress.  Abdominal: Soft. There is no tenderness. There is no rebound and no guarding.  Musculoskeletal: He exhibits no edema or tenderness.  Neurological: He is alert and oriented to person, place, and time.  Skin: Skin is warm and dry.  Psychiatric: He has a normal mood and affect. His behavior is normal.  Nursing note and vitals reviewed.   ED Course  Procedures (including critical care time) Labs Review Labs Reviewed  COMPREHENSIVE METABOLIC PANEL - Abnormal; Notable for the following:    Sodium 128 (*)    Chloride 93 (*)    BUN <5 (*)    Creatinine, Ser 0.44 (*)    AST 86 (*)    Alkaline Phosphatase 157 (*)    All other components within normal limits  CBC - Abnormal; Notable for the following:    WBC 10.8 (*)    [facility]  34.7 (*)    MCHC 37.0 (*)    All other components within normal limits  PROTIME-INR  QUANTIFERON TB GOLD ASSAY (BLOOD)  TYPE AND SCREEN  ABO/RH    Imaging Review Dg Chest 2 View  10/20/2015  CLINICAL DATA:  Hemoptysis for 2 weeks.  Initial encounter. EXAM: CHEST  2 VIEW COMPARISON:  None. FINDINGS: The chest is hyperexpanded with attenuation of the pulmonary vasculature. The lungs are clear. No pneumothorax or pleural effusion. No focal bony abnormality. IMPRESSION: COPD without acute disease. Electronically Signed   By: Inge Rise M.D.   On: 10/20/2015 13:14   Ct Soft Tissue Neck W Contrast  10/20/2015  CLINICAL DATA:  Neck swelling. Hemoptysis for 2  weeks. Abnormal sensation in throat. EXAM: CT NECK WITH CONTRAST TECHNIQUE: Multidetector CT imaging of the neck was performed using the standard protocol following the bolus administration of intravenous contrast. CONTRAST:  43m ISOVUE-300 IOPAMIDOL (ISOVUE-300) INJECTION 61% COMPARISON:  None. FINDINGS: Pharynx and larynx: Symmetric appearance of the pharyngeal soft tissues without mass identified. Unremarkable larynx. Multiple dental caries. Salivary glands: Unremarkable appearance of the submandibular glands. A few punctate parotid calcifications are noted bilaterally. No parotid mass or inflammatory change is seen. Thyroid: 1.3 cm hypodense nodule on the left thyroid lobe incidentally noted. Lymph nodes: No enlarged lymph nodes are identified in the neck. Vascular: Mild-to-moderate atherosclerotic calcification about the carotid bifurcations. Prominent arch vessel atherosclerosis partially visualized. Major vascular structures of the neck appear patent. Limited intracranial: The visualized portion the brain is unremarkable. Visualized orbits: Unremarkable. Mastoids and visualized paranasal sinuses: Clear. Skeleton: Moderate mid to lower cervical disc degeneration. Prominent subcutaneous fat throughout the posterior neck as well as upper anterior neck/submandibular region. Upper chest: 1.6 x 1.6 cm left apical lung lesion with the both cavitary and nodular soft tissue components. IMPRESSION: 1. No mass or acute abnormality identified in the neck. 2. 1.6 cm cavitary lesion in the left lung apex. This may reflect a primary lung carcinoma or be infectious. Consider chest CT to evaluate the remainder of the lungs for other lesions. Electronically Signed   By: ALogan BoresM.D.   On: 10/20/2015 16:55   Ct Chest Wo Contrast  10/20/2015  CLINICAL DATA:  Hemoptysis.  Cough and neck swelling. EXAM: CT CHEST WITHOUT CONTRAST TECHNIQUE: Multidetector CT imaging of the chest was performed following the standard protocol  without IV contrast. COMPARISON:  None FINDINGS: Mediastinum: Normal heart size. Aortic atherosclerosis is identified. Calcification within the RCA coronary artery is identified. 1.4 cm right paratracheal lymph node is identified, image 40 of series 2. No supraclavicular or axillary adenopathy. Lungs/Pleura: No pleural fluid identified. Moderate changes of centrilobular emphysema. There is a cavitary part solid nodule within the left apex anteriorly measuring 2.3 cm, image 17 of series 5. Within the superior segment of left lower lobe there is a spiculated nodule which measures 2.1 cm, image 50 of series 5. Small part solid nodule within the left upper lobe is identified measuring 1.3 cm, image 44 of series 5. Upper Abdomen: Left adrenal adenoma is identified measuring 1.8 cm, image 100 of series 2. Right adrenal gland is normal. No focal liver abnormality identified the visualized portions of the spleen are normal. Musculoskeletal: Multi level spondylosis is present within the thoracic spine. No aggressive lytic or sclerotic bone lesions. Chronic left lateral rib fracture deformities are identified. IMPRESSION: 1. There are several suspicious nodules identified within the left lung which are worrisome for primary bronchogenic carcinoma. Further evaluation with PET-CT  and tissue sampling if indicated. 2. Enlarged right paratracheal lymph node. In the setting of bronchogenic carcinoma this would be suspicious metastatic adenopathy 3. Emphysema 4. Aortic atherosclerosis and RCA coronary artery calcification. Electronically Signed   By: Kerby Moors M.D.   On: 10/20/2015 18:28   I have personally reviewed and evaluated these images and lab results as part of my medical decision-making.   EKG Interpretation None      MDM   Final diagnoses:  Cough with hemoptysis  Patient here for hemoptysis for the last 2 weeks and sensation of throat swelling. He has multiple soft tissue masses on his neck that on exam  are consistent with lipomas. CT neck without any acute after malady. CT chest has findings that are concerning for bronchogenic carcinoma. Discussed with oncologist on call who recommends discussion with pulmonary to arrange outpatient bronchoscopy. Plan to DC home with outpatient pulmonary appointment for bronchoscopy for definitive diagnosis. Patient met with case management to arrange outpatient follow-up for with [facility] health [facility] wellness as well.     Quintella Reichert, MD 10/20/15 2342

## 2015-10-20 NOTE — Progress Notes (Signed)
CSW was notified by ED secretary of consult form EDP.  CSW reached out to EDP. She states pt needs information for PCP. CSW informed EDP that this consult will be appropriate for Nurse CM.  Willette Brace [phone] ED CSW 10/20/2015 4:52 PM

## 2015-10-22 ENCOUNTER — Telehealth: Payer: Self-pay | Admitting: *Deleted

## 2015-10-22 NOTE — Telephone Encounter (Signed)
This message was in [facility] Staff Message box in [place] which she does not check.  Need to get appointment scheduled per Dr. Alva Garnet. Thanks.

## 2015-10-22 NOTE — Telephone Encounter (Signed)
[name] x 1 for pt or daughter to call out office to schedule appt.

## 2015-10-22 NOTE — Telephone Encounter (Signed)
-----   Message from Wilhelmina Mcardle, MD sent at 10/20/2015  7:49 PM EDT ----- Lynelle Smoke,   This gentleman was evaluated in the ED 10/20/15 with hemoptysis and CT chest revealed cavitary lung lesion. He did not require admission. I told ED physician that we would contact him tomorrow (04/26) to arrange pulmonary eval in office. Please facilitate this. Thanks  Waunita Schooner

## 2015-10-23 ENCOUNTER — Encounter: Payer: Self-pay | Admitting: Internal Medicine

## 2015-10-23 ENCOUNTER — Encounter: Payer: Self-pay | Admitting: *Deleted

## 2015-10-23 ENCOUNTER — Ambulatory Visit (INDEPENDENT_AMBULATORY_CARE_PROVIDER_SITE_OTHER): Payer: Self-pay | Admitting: Internal Medicine

## 2015-10-23 VITALS — BP 142/82 | HR 106 | Ht 63.0 in | Wt 118.6 lb

## 2015-10-23 DIAGNOSIS — R042 Hemoptysis: Secondary | ICD-10-CM | POA: Insufficient documentation

## 2015-10-23 DIAGNOSIS — Z720 Tobacco use: Secondary | ICD-10-CM

## 2015-10-23 DIAGNOSIS — R918 Other nonspecific abnormal finding of lung field: Secondary | ICD-10-CM

## 2015-10-23 NOTE — Progress Notes (Signed)
[facility] Consultation    Date: 10/23/2015  MRN# [id number] David Warren 15-Mar-1956  Referring Physician: self referral PMD -  David Warren is a 60 y.o. old male seen in consultation for abnormal CT Chest Scan  CC:  Chief Complaint  Patient presents with  . pulmonary consult    self ref. pt c/o coughing up blood went to [facility]. CT showed 4 spots on lung. c/o occ cough up blood X3wk. denies SOB, wheezing or cp/tightness.     HPI:  Patient is a 60 year old Hispanic male presenting as a self-referral for increasing hemoptysis over the last 2-3 weeks. He is accompanied by [place] interpreter and wife and daughter today. Through interpreter and daughter, patient's been having increased hemoptysis over the past 2-3 weeks. They presented to [facility] earlier this week, with a CT scan was obtained that showed he had 3 dominant left sided pulmonary lesions that were concerning for malignancy along with an enlarged right paratracheal lymph node. He is a current smoker, pack a day for about 53 years, he also drinks alcohol on a daily basis 6-8 cans of beer. Prior to this episode he had no other previous episodes of hemoptysis. He does not endorse any night sweats, but does state he's had about a 16 pound weight loss since February. He has significant lipoma on the posterior neck which has been told by other physicians to family that it is benign and surgery is elective as long as it is not impinging on any major structures. Hemoptysis is described as intermittent, sometimes frank blood mixed with sputum, sometimes speckled blood in sputum. It is not more than half a cup to a cup per day, and it is not every day. 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  PMHX:   Past Medical History  Diagnosis Date  . Hypertension   . Tobacco abuse   . Lipoma of neck    Surgical Hx:  History reviewed. No pertinent past surgical history. Family  Hx:  Family History  Problem Relation Age of Onset  . Family history unknown: Yes   Social Hx:   Social History  Substance Use Topics  . Smoking status: Current Every Day Smoker -- 1.00 packs/day for 53 years  . Smokeless tobacco: None  . Alcohol Use: 7.2 oz/week    12 Cans of beer per week   Medication:   Current Outpatient Rx  Name  Route  Sig  Dispense  Refill  . ibuprofen (ADVIL,MOTRIN) 200 MG tablet   Oral   Take 400 mg by mouth every 6 (six) hours as needed (pain).             Allergies:  Review of patient's allergies indicates no known allergies.  Review of Systems  Constitutional: Negative for fever and chills.  Eyes: Negative for blurred vision.  Respiratory: Positive for cough, hemoptysis and shortness of breath.   Gastrointestinal: Negative for heartburn, nausea, vomiting, abdominal pain and blood in stool.  Genitourinary: Negative for dysuria.  Musculoskeletal: Negative for falls.  Neurological: Negative for dizziness and headaches.  Endo/Heme/Allergies: Does not bruise/bleed easily.  Psychiatric/Behavioral: Negative for depression.     Physical Examination:   VS: BP 142/82 mmHg  Pulse 106  Ht '5\' 3"'$  (1.6 m)  Wt 118 lb 9.6 oz (53.797 kg)  BMI 21.01 kg/m2  SpO2 97%  General Appearance: No distress  Neuro:without focal findings, mental status, speech normal, alert and oriented, cranial nerves  2-12 intact, reflexes normal and symmetric, sensation grossly normal  HEENT: PERRLA, EOM intact, no ptosis, lipoma of posterior neck (soft, non tender).  Pulmonary: normal breath sounds., diaphragmatic excursion normal.No wheezing, No rales;   Sputum Production:   CardiovascularNormal S1,S2.  No m/r/g.  Abdominal aorta pulsation normal.    Abdomen: Benign, Soft, non-tender, No masses, hepatosplenomegaly, No lymphadenopathy Renal:  No costovertebral tenderness  GU:  No performed at this time. Endoc: No evident thyromegaly, no signs of acromegaly or Cushing  features Skin:   warm, no rashes, no ecchymosis  Extremities: normal, no cyanosis, clubbing, no edema, warm with normal capillary refill. Other findings:none    Rad results: (The following images and results were reviewed by Dr. Stevenson Clinch on 10/23/2015). CT Chest 10/20/15 Multidetector CT imaging of the chest was performed following the standard protocol without IV contrast.  COMPARISON: None  FINDINGS: Mediastinum: Normal heart size. Aortic atherosclerosis is identified. Calcification within the RCA coronary artery is identified. 1.4 cm right paratracheal lymph node is identified, image 40 of series 2. No supraclavicular or axillary adenopathy.  Lungs/Pleura: No pleural fluid identified. Moderate changes of centrilobular emphysema. There is a cavitary part solid nodule within the left apex anteriorly measuring 2.3 cm, image 17 of series 5. Within the superior segment of left lower lobe there is a spiculated nodule which measures 2.1 cm, image 50 of series 5. Small part solid nodule within the left upper lobe is identified measuring 1.3 cm, image 44 of series 5.  Upper Abdomen: Left adrenal adenoma is identified measuring 1.8 cm, image 100 of series 2. Right adrenal gland is normal. No focal liver abnormality identified the visualized portions of the spleen are normal.  Musculoskeletal: Multi level spondylosis is present within the thoracic spine. No aggressive lytic or sclerotic bone lesions. Chronic left lateral rib fracture deformities are identified.  IMPRESSION: 1. There are several suspicious nodules identified within the left lung which are worrisome for primary bronchogenic carcinoma. Further evaluation with PET-CT and tissue sampling if indicated. 2. Enlarged right paratracheal lymph node. In the setting of bronchogenic carcinoma this would be suspicious metastatic adenopathy 3. Emphysema 4. Aortic atherosclerosis and RCA coronary artery calcification.      Assessment and Plan: 60 year old male tobacco use, now with left-sided pulmonary lesions suspicious for malignancy, seen in office today for further workup and evaluation. Pulmonary nodules/lesions, multiple Left-sided pulmonary lesions concerning for malignancy,  Risk factors include prolonged smoking history, age, alcohol use, and and overall characteristics (spiculated, cavitary, upper lobe) of the lesions. Differentials include: Infection, fungal infection, malignancy, vasculitis  Most likely this is either a malignancy or infection. No significant risk factors for TB.  Plan: -Electronic navigational bronchoscopy -Bronchoscopy via EBUS -BAL with bronchoscopy for AFB, fungus, virus, microbiology, cytology -Stop smoking 2 weeks prior to scheduled procedure  Tobacco abuse Tobacco Cessation - Counseling regarding benefits of smoking cessation strategies was provided for more than 12 min. - Educated that at this time smoking- cessation represents the single most important step that patient can take to enhance the length and quality of live. - Educated patient regarding alternatives of behavior interventions, pharmacotherapy including NRT and non-nicotine therapy such, and combinations of both. - Patient at this time: Quit on his own   Hemoptysis Differential diagnosis includes: Infection (fungal versus microbial), malignancy No frank hemoptysis noted Intermittent hemoptysis, with abnormal chest CT findings of left-sided pulmonary lesions one with cavitary appearance. No fevers, no chills, no weakness, no significant findings to suggest active infection, will hold off  on antibiotic therapy at this time until a more accurate yield can be obtained from BAL during bronchoscopy.   Plan: -Bronchoscopy with BAL -See plan for pulmonary lesions    Updated Medication List Outpatient Encounter Prescriptions as of 10/23/2015  Medication Sig  . ibuprofen (ADVIL,MOTRIN) 200 MG tablet Take  400 mg by mouth every 6 (six) hours as needed (pain).  . [DISCONTINUED] HYDROcodone-acetaminophen (NORCO/VICODIN) 5-325 MG per tablet Take 1 tablet by mouth every 6 (six) hours as needed. (Patient not taking: Reported on 10/20/2015)   No facility-administered encounter medications on file as of 10/23/2015.    Orders for this visit: No orders of the defined types were placed in this encounter.     Thank  you for the consultation and for allowing [facility] to assist in the care of your patient. Our recommendations are noted above.  Please contact us if we can be of further service.   Vilinda Boehringer, MD [facility] Office Number: [phone]  Note: This note was prepared with Dragon dictation along with smaller phrase technology. Any transcriptional errors that result from this process are unintentional.

## 2015-10-23 NOTE — Assessment & Plan Note (Addendum)
Differential diagnosis includes: Infection (fungal versus microbial), malignancy No frank hemoptysis noted Intermittent hemoptysis, with abnormal chest CT findings of left-sided pulmonary lesions one with cavitary appearance. No fevers, no chills, no weakness, no significant findings to suggest active infection, will hold off on antibiotic therapy at this time until a more accurate yield can be obtained from BAL during bronchoscopy.   Plan: -Bronchoscopy with BAL -See plan for pulmonary lesions

## 2015-10-23 NOTE — Progress Notes (Signed)
Oncology Nurse Navigator Documentation  Oncology Nurse Navigator Flowsheets 10/23/2015  Navigator Encounter Type Other  Treatment Phase Pre-Tx/Tx Discussion  Barriers/Navigation Needs Coordination of Care  Interventions Coordination of Care  Acuity Level 1  Time Spent with Patient 15   I received referral on David Warren.  Patient is seeing Dr. Alva Garnet for tissue dx.  I reached out to him and asked that he contact me if patient needs to see med or rad onc.

## 2015-10-23 NOTE — Patient Instructions (Addendum)
Follow up with Dr. Stevenson Clinch 2 weeks after bronchoscopy - you have lung lesions in the left lung that require further workup with a bronchoscopy - we will setup a bronchoscopy (ENB/EBUS with biopsy and BAL, AFB, Fungal cultures, etc) - stop smoking - cut back on drinking

## 2015-10-23 NOTE — Assessment & Plan Note (Addendum)
Left-sided pulmonary lesions concerning for malignancy,  Risk factors include prolonged smoking history, age, alcohol use, and and overall characteristics (spiculated, cavitary, upper lobe) of the lesions. Differentials include: Infection, fungal infection, malignancy, vasculitis  Most likely this is either a malignancy or infection. No significant risk factors for TB.  Plan: -Electronic navigational bronchoscopy -Bronchoscopy via EBUS -BAL with bronchoscopy for AFB, fungus, virus, microbiology, cytology -Stop smoking 2 weeks prior to scheduled procedure

## 2015-10-23 NOTE — Assessment & Plan Note (Signed)
Tobacco Cessation - Counseling regarding benefits of smoking cessation strategies was provided for more than 12 min. - Educated that at this time smoking- cessation represents the single most important step that patient can take to enhance the length and quality of live. - Educated patient regarding alternatives of behavior interventions, pharmacotherapy including NRT and non-nicotine therapy such, and combinations of both. - Patient at this time: Quit on his own

## 2015-10-26 ENCOUNTER — Encounter: Payer: Self-pay | Admitting: Internal Medicine

## 2015-10-26 ENCOUNTER — Ambulatory Visit: Payer: Self-pay | Attending: Internal Medicine | Admitting: Internal Medicine

## 2015-10-26 VITALS — BP 190/85 | HR 92 | Temp 98.4°F | Resp 18 | Ht 63.0 in | Wt 123.2 lb

## 2015-10-26 DIAGNOSIS — I10 Essential (primary) hypertension: Secondary | ICD-10-CM | POA: Insufficient documentation

## 2015-10-26 DIAGNOSIS — R042 Hemoptysis: Secondary | ICD-10-CM | POA: Insufficient documentation

## 2015-10-26 DIAGNOSIS — D170 Benign lipomatous neoplasm of skin and subcutaneous tissue of head, face and neck: Secondary | ICD-10-CM | POA: Insufficient documentation

## 2015-10-26 DIAGNOSIS — Z87891 Personal history of nicotine dependence: Secondary | ICD-10-CM | POA: Insufficient documentation

## 2015-10-26 DIAGNOSIS — Z720 Tobacco use: Secondary | ICD-10-CM

## 2015-10-26 DIAGNOSIS — R918 Other nonspecific abnormal finding of lung field: Secondary | ICD-10-CM

## 2015-10-26 DIAGNOSIS — J449 Chronic obstructive pulmonary disease, unspecified: Secondary | ICD-10-CM | POA: Insufficient documentation

## 2015-10-26 MED ORDER — LISINOPRIL-HYDROCHLOROTHIAZIDE 20-25 MG PO TABS: 1.0000 | ORAL_TABLET | Freq: Every day | ORAL | Status: DC

## 2015-10-26 MED FILL — LISINOPRIL-HCTZ 20-25 MG TA: 20-25 | 90 days supply | Qty: 90 | Fill #0

## 2015-10-26 NOTE — Patient Instructions (Addendum)
- financial aid packet.  Hipertensin (Hypertension) El trmino hipertensin es otra forma de denominar a la presin arterial elevada. La presin arterial elevada fuerza al corazn a trabajar ms para bombear la sangre. Una lectura de la presin arterial consta de dos nmeros: uno ms alto sobre uno ms bajo (por ejemplo, 110/72). CUIDADOS EN EL HOGAR   Haga que el mdico le tome nuevamente la presin arterial.  Tome los medicamentos solamente como se lo haya indicado el mdico. Siga cuidadosamente las indicaciones. Los medicamentos pierden eficacia si omite dosis. El hecho de omitir las dosis tambin [place] el riesgo de otros problemas.  No fume.  Contrlese la presin arterial en su casa como se lo haya indicado el mdico. SOLICITE AYUDA SI:  Piensa que tiene una reaccin a los medicamentos que est tomando.  Tiene mareos o dolores de cabeza reiterados.  Se le inflaman (hinchan) los tobillos.  Tiene problemas de visin. SOLICITE AYUDA DE INMEDIATO SI:   Tiene un dolor de cabeza muy intenso y est confundido.  Se siente dbil, aturdido o se desmaya.  Tiene dolor en el pecho o el estmago (abdominal).  Tiene vmitos.  No puede respirar [employer]. ASEGRESE DE QUE:   Comprende estas instrucciones.  Controlar su afeccin.  Recibir ayuda de inmediato si no mejora o si empeora.   Esta informacin no tiene Marine scientist el consejo del mdico. Asegrese de hacerle al mdico cualquier pregunta que tenga.   Document Released: 12/01/2009 Document Revised: 06/18/2013 Elsevier Interactive Patient Education 2016 [place] de alimentacin con bajo contenido de sodio (Low-Sodium Eating Plan) El sodio aumenta la presin arterial y hace que el cuerpo retenga lquidos. El consumo de alimentos con menos sodio ayuda a Armed forces technical officer presin arterial, a Forensic psychologist y a Tour manager, el hgado y los riones. Agregar sal (cloruro de sodio) a los  alimentos aumenta el aporte de [place]. La mayor parte del sodio proviene de los alimentos enlatados, envasados y congelados. La pizza, la comida rpida y la comida de los restaurantes tambin contienen mucho sodio. Aunque usted tome medicamentos para bajar la presin arterial o reducir el lquido del cuerpo, es importante que disminuya el aporte de sodio de los alimentos. EN QU CONSISTE EL PLAN? La [employer] de las personas deberan limitar la ingesta de sodio a '2300mg'$  por Training and development officer. El mdico le recomienda que limite su consumo de sodio a __________ [employer].  QU DEBO SABER ACERCA DE ESTE PLAN DE [place]? Para el plan de alimentacin con bajo contenido de sodio, debe seguir estas pautas generales:  Elija alimentos con un valor porcentual diario de sodio de menos del 5% (segn se indica en la etiqueta).  Use hierbas o aderezos sin sal, en lugar de sal de mesa o sal marina.  Consulte al mdico o farmacutico antes de usar sustitutos de la sal.  Coma alimentos frescos.  Coma ms frutas y verduras.  Limite las verduras enlatadas. Si las consume, enjuguelas bien para disminuir el sodio.  Limite el consumo de queso a 1onza (28g) por Training and development officer.  Coma productos con bajo contenido de sodio, cuya etiqueta suele decir "bajo contenido de sodio" o "sin agregado de sal".  Evite alimentos que contengan glutamato monosdico (MSG), que a veces se agrega a la comida [place] y a algunos alimentos enlatados.  Consulte las etiquetas de los alimentos (etiquetas de informacin nutricional) para saber cunto sodio contiene una porcin.  Consuma ms comida casera y menos de restaurante, de buf y  comida rpida.  Cuando coma en un restaurante, pida que preparen su comida con menos sal o, en lo posible, sin nada de sal. CMO LEO LA INFORMACIN SOBRE EL SODIO EN LAS ETIQUETAS DE LOS ALIMENTOS? La etiqueta de informacin nutricional indica la cantidad de sodio en una porcin de alimento. Si come ms de una porcin,  debe multiplicar la cantidad indicada de sodio por la cantidad de porciones. Las etiquetas de los alimentos tambin pueden indicar lo siguiente:  Sin sodio: menos de '5mg'$  por porcin.  Cantidad muy baja de sodio: '35mg'$  o menos por porcin.  Cantidad baja de sodio: '140mg'$  o menos por porcin.  Menor cantidad de sodio: 50% menos de sodio en una porcin. Por ejemplo, si un alimento generalmente contiene 300 mg de sodio se modifica para ser [employer], tendr 150 mg de sodio.  Sodio reducido: 25% menos de Agricultural consultant. Por ejemplo, si un alimento que por lo general contiene '400mg'$  de sodio se modifica para convertirse en un alimento de sodio reducido, tendr '300mg'$  de sodio. QU ALIMENTOS PUEDO COMER? Cereales Cereales con bajo contenido de sodio, como [place], arroz y trigo [place], y trigo triturado. Galletas con bajo contenido de [place]. Arroz y pastas sin sal. Pan con bajo contenido de [place].  Verduras Verduras frescas o congeladas. Verduras enlatadas con bajo contenido de sodio o reducido de sodio. Pasta y salsa de tomate con contenido bajo o [place]. Jugos de tomate y verduras con contenido bajo o reducido de sodio.  Lambert Mody Frutas frescas, congeladas y IT sales professional. Jugo de frutas.  Carnes y otros productos con protenas Atn y salmn enlatado con bajo contenido de [place]. Carne de vaca o ave, pescado y frutos de mar frescos o congelados. Cordero. Frutos secos sin sal. Lentejas, frijoles y guisantes secos, sin sal agregada. Frijoles enlatados sin sal. Sopas caseras sin sal. Huevos.  Lcteos Leche. Leche de soja. Queso ricota. Quesos con contenido bajo o reducido de sodio. Yogur.  Condimentos Hierbas y especias frescas y secas. Aderezos sin sal. Cebolla y ajo en polvo. Variedades de [place] y ketchup con bajo contenido de sodio. Rbano picante fresco o refrigerado. Jugo de limn.  Grasas y aceites Aderezos para ensalada con contenido reducido de [place].  Mantequilla sin sal.  Otros Palomitas de maz y pretzels sin sal.  Los artculos mencionados arriba pueden no ser [employer] de las bebidas o los alimentos recomendados. Comunquese con el nutricionista para conocer ms opciones. QU ALIMENTOS NO SE RECOMIENDAN? Cereales  Cereales instantneos para comer caliente. Mezclas para bizcochos, panqueques y rellenos de pan. Crutones. Mezclas para pastas o arroz con condimento. Envases comerciales de sopa de fideos. Macarrones con queso envasados o congelados. Harina leudante. Galletas saladas comunes. Verduras Verduras enlatadas comunes. Pasta y salsa de tomate en lata comunes. Jugos comunes de tomate y de verduras. Verduras Surveyor, minerals. Papas fritas saladas. Aceitunas. Pepinillos. Salsas. Chucrut. Salsa. Carnes y otros productos con protenas Carne de vaca, pescado o frutos de mar que est salada, [place], [place], condimentada con especias o con pickles. Panceta, jamn, salchichas, perros calientes, carne curada, carne picada (carne envasada de buey) y embutidos. Cerdo salado. Cecina o charqui. Arenque en escabeche. Anchoas, atn enlatado comn y sardinas. Frutos secos con sal. Ines Bloomer para untar y quesos procesados. Requesn. Queso azul y cottage. Suero de Lowes.  Condimentos Sal de cebolla y ajo, sal condimentada, sal de mesa y sal marina. Salsas en lata y envasadas. Salsa [place]. Salsa trtara. Salsa barbacoa. Salsa  teriyaki. Salsa de soja, incluso la que tiene contenido reducido de [place]. Salsa de carne. Salsa de pescado. Salsa de [place]. Salsa rosada. Rbano picante envasado. Ketchup y mostaza comunes. Saborizantes y tiernizantes para carne. Caldo en cubitos. Salsa picante. Salsa tabasco. Adobos. Aderezos para tacos. Salsas. Grasas y aceites Aderezos comunes para ensalada. [place] con sal. Margarina. Mantequilla clarificada. Grasa de panceta.  Otros Nachos y papas fritas envasadas. Maz inflado y frituras de  maz. Palomitas de maz y pretzels con sal. Sopas enlatadas o en polvo. Pizza. Pasteles y entradas congeladas.  Los artculos mencionados arriba pueden no ser [employer] de las bebidas y los alimentos que se Higher education careers adviser. Comunquese con el nutricionista para obtener ms informacin.   Esta informacin no tiene Marine scientist el consejo del mdico. Asegrese de hacerle al mdico cualquier pregunta que tenga.   Document Released: 06/13/2005 Document Revised: 07/04/2014 Elsevier Interactive Patient Education 2016 [place] de alimentacin DASH (DASH Eating Plan) DASH es la sigla en ingls de "Enfoques Alimentarios para Detener la Hipertensin". El plan de alimentacin DASH ha demostrado bajar la presin arterial elevada (hipertensin). Los beneficios adicionales para la salud pueden incluir la disminucin del riesgo de diabetes mellitus tipo2, enfermedades cardacas e ictus. Este plan tambin puede ayudar a Horticulturist, commercial. QU DEBO SABER ACERCA DEL PLAN DE ALIMENTACIN DASH? Para el plan de alimentacin DASH, seguir las siguientes pautas generales:  Elija los alimentos con un valor porcentual diario de sodio de menos del 5% (segn figura en la etiqueta del alimento).  Use hierbas o aderezos sin sal, en lugar de sal de mesa o sal marina.  Consulte al mdico o farmacutico antes de usar sustitutos de la sal.  Coma productos con bajo contenido de sodio, cuya etiqueta suele decir "bajo contenido de sodio" o "sin agregado de sal".  Coma alimentos frescos.  Coma ms verduras, frutas y productos lcteos con bajo contenido de [place].  Elija los cereales integrales. Busque la palabra "integral" en Equities trader de la lista de ingredientes.  Elija el pescado y el pollo o el pavo sin piel ms a menudo que las carnes rojas. Limite el consumo de pescado, carne de ave y carne a 6onzas (170g) por Training and development officer.  Limite el consumo de dulces, postres, azcares y bebidas  azucaradas.  Elija las grasas saludables para el corazn.  Limite el consumo de queso a 1onza (28g) por Training and development officer.  Consuma ms comida casera y menos de restaurante, de buf y comida rpida.  Limite el consumo de alimentos fritos.  Cocine los alimentos utilizando mtodos que no sean la fritura.  Limite las verduras enlatadas. Si las consume, enjuguelas bien para disminuir el sodio.  Cuando coma en un restaurante, pida que preparen su comida con menos sal o, en lo posible, sin nada de sal. QU ALIMENTOS PUEDO COMER? Pida ayuda a un nutricionista para conocer las necesidades calricas individuales. Cereales Pan de salvado o integral. Arroz integral. Pastas de salvado o integrales. Quinua, trigo burgol y cereales integrales. Cereales con bajo contenido de sodio. Tortillas de harina de maz o de salvado. Pan de maz integral. Galletas saladas integrales. Galletas con bajo contenido de [place]. Vegetales Verduras frescas o congeladas (crudas, al vapor, asadas o grilladas). Jugos de tomate y verduras con contenido bajo o reducido de sodio. Pasta y salsa de tomate con contenido bajo o [place]. Verduras enlatadas con bajo contenido de sodio o reducido de sodio.  Lambert Mody Lambert Mody frescas, en conserva (en su jugo natural) o  frutas congeladas. Carnes y otros productos con protenas Carne de res molida (al 85% o ms [place]), carne de res de animales alimentados con pastos o carne de res sin la grasa. Pollo o pavo sin piel. Carne de pollo o de [place]. Cerdo sin la grasa. Todos los pescados y frutos de mar. Huevos. Porotos, guisantes o lentejas secos. Frutos secos y semillas sin sal. Frijoles enlatados sin sal. Lcteos Productos lcteos con bajo contenido de grasas, como [place] o al 1%, quesos reducidos en grasas o al 2%, ricota con bajo contenido de grasas o [employer], o yogur natural con bajo contenido de [place]. Quesos con contenido bajo o reducido de sodio. Grasas y  Naval architect en barra que no contengan grasas trans. [place] y alios para ensaladas livianos o reducidos en grasas (reducidos en sodio). [place]. Aceites de crtamo, oliva o canola. Mantequilla natural de man o almendra. Otros Palomitas de maz y pretzels sin sal. Los artculos mencionados arriba pueden no ser [employer] Company de las bebidas o los alimentos recomendados. Comunquese con el nutricionista para conocer ms opciones. QU ALIMENTOS NO SE RECOMIENDAN? Cereales Pan blanco. Pastas blancas. Arroz blanco. Pan de maz refinado. Bagels y croissants. Galletas saladas que contengan grasas trans. Vegetales Vegetales con crema o fritos. Verduras en [place]. Verduras enlatadas comunes. Pasta y salsa de tomate en lata comunes. Jugos comunes de tomate y de verduras. Lambert Mody Frutas secas. Fruta enlatada en almbar liviano o espeso. Jugo de frutas. Carnes y otros productos con protenas [place] de carne con Lobbyist. Costillas, alas de pollo, tocineta, salchicha, mortadela, salame, chinchulines, tocino, perros calientes, salchichas alemanas y embutidos envasados. Frutos secos y semillas con sal. Frijoles con sal en lata. Lcteos Leche entera o al 2%, crema, mezcla de [place] y crema, y queso crema. Yogur entero o endulzado. Quesos o queso azul con alto contenido de Physicist, medical. [place]remas no lcteas y coberturas batidas. Quesos procesados, quesos para untar o cuajadas. Condimentos Sal de cebolla y ajo, sal condimentada, sal de mesa y sal marina. Salsas en lata y envasadas. Salsa [place]. Salsa trtara. Salsa barbacoa. Salsa teriyaki. Salsa de soja, incluso la que tiene contenido reducido de [place]. Salsa de carne. Salsa de pescado. Salsa de [place]. Salsa rosada. Rbano picante. Ketchup y mostaza. Saborizantes y tiernizantes para carne. Caldo en cubitos. Salsa picante. Salsa tabasco. Adobos. Aderezos para tacos. Salsas. Grasas y aceites [place], [place] en barra, [place] de [place],  [place], [place] clarificada y Wendee Copp de tocino. Aceites de coco, de palmiste o de palma. Aderezos comunes para ensalada. Otros Pickles y [place]. Palomitas de maz y pretzels con sal. Los artculos mencionados arriba pueden no ser [employer] de las bebidas y los alimentos que se Higher education careers adviser. Comunquese con el nutricionista para obtener ms informacin. DNDE Dolan Amen MS INFORMACIN? [place], del Pulmn y de Herbalist (National Heart, Lung, and [place]): travelstabloid.com   Esta informacin no tiene Marine scientist el consejo del mdico. Asegrese de hacerle al mdico cualquier pregunta que tenga.   Document Released: 06/02/2011 Document Revised: 07/04/2014 Elsevier Interactive Patient Education [employer].

## 2015-10-26 NOTE — Progress Notes (Signed)
Patient is here for ED FU  Patient denies pain at this time.  Patient has taken medication today and patient has eaten.

## 2015-10-26 NOTE — Progress Notes (Signed)
David Warren, is a 60 y.o. male  WNU:[id number]  IHK:[id number]  DOB - [birth year]  CC:  Chief Complaint  Patient presents with  . Follow-up    Lung Cancer       HPI: David Warren is a 60 y.o. male here today to establish medical care.  New to our clinic. Recent dx on CT for abnormal lung nodules, and has f/u w/ Pulmonology.  Pending BAL/bronch.  Pt has since quit smoking cold [place] (had 53 pack year hx) and now drinking 4-5 beers a day (use to drink 6-8cans daily).  Of note, hx of elavated BP 192/93  ED visit 10/20/15, but semi normal on Pulm visit 10/23/15 with 142/82.  Pt states he ate some tacos yesterday and added salt.  Did not know he had htn and should avoid salt.  Denies any sx currently, no sob/ha/visual  Changes.  Patient has No headache, No chest pain, No abdominal pain - No Nausea, No new weakness tingling or numbness, No Cough - SOB.  Here w/ his dgt, who is assisting [place] to interpret.  Spanish interpreter available by phone, but pt preferred dgt interpret for him.  No Known Allergies Past Medical History  Diagnosis Date  . Hypertension   . Tobacco abuse   . Lipoma of neck    Current Outpatient Prescriptions on File Prior to Visit  Medication Sig Dispense Refill  . ibuprofen (ADVIL,MOTRIN) 200 MG tablet Take 400 mg by mouth every 6 (six) hours as needed (pain).     No current facility-administered medications on file prior to visit.   Family History  Problem Relation Age of Onset  . Family history unknown: Yes   Social History   Social History  . Marital Status: Married    Spouse Name: N/A  . Number of Children: N/A  . Years of Education: N/A   Occupational History  . Not on file.   Social History Main Topics  . Smoking status: Former Smoker -- 0.00 packs/day for 84 years    Quit date: 10/24/2015  . Smokeless tobacco: Not on file  . Alcohol Use: 7.2 oz/week    12 Cans of beer per week  . Drug Use: No  . Sexual Activity: Yes   Other Topics Concern   . Not on file   Social History Narrative    Review of Systems: Constitutional: Negative for fever, chills, diaphoresis, activity change, appetite change and fatigue. About 15 lb weight loss since February. HENT: Negative for ear pain, nosebleeds, congestion, facial swelling, rhinorrhea, neck pain, neck stiffness and ear discharge.   Large lipoma behind neck, but denies pain/difficulty swallow.  He was told it was benign, but can have it surgically removed later if bothering him per doctors prior. Eyes: Negative for pain, discharge, redness, itching and visual disturbance. Respiratory: Negative for cough, choking, chest tightness, shortness of breath, wheezing and stridor.  +hemoptysis much less than prior Cardiovascular: Negative for chest pain, palpitations and leg swelling. Gastrointestinal: Negative for abdominal distention. Genitourinary: Negative for dysuria, urgency, frequency, hematuria, flank pain, decreased urine volume, difficulty urinating and dyspareunia.  Musculoskeletal: Negative for back pain, joint swelling, arthralgia and gait problem. Neurological: Negative for dizziness, tremors, seizures, syncope, facial asymmetry, speech difficulty, weakness, light-headedness, numbness and headaches.  Hematological: Negative for adenopathy. Does not bruise/bleed easily. Psychiatric/Behavioral: Negative for hallucinations, behavioral problems, confusion, dysphoric mood, decreased concentration and agitation.    Objective:   Filed Vitals:   10/26/15 1549  BP: 190/85  Pulse: 92  Temp:  98.4 F (36.9 C)  Resp: 18    Physical Exam: Constitutional: Patient appears well-developed and well-nourished. No distress. AAOx3, thin appearing, pleasant. HENT: Normocephalic, atraumatic, External right and left ear normal. Oropharynx is clear and moist.   Large lipoma occipital region, about 7 x 5 inches diameter. Eyes: Conjunctivae and EOM are normal. PERRL, no scleral icterus. Neck: Normal  ROM. Neck supple. No JVD.  No thyromegaly. CVS: RRR, S1/S2 +, no murmurs, no gallops, no carotid bruit.  Pulmonary: Effort and breath sounds normal, no stridor, rhonchi, wheezes, rales.  Abdominal: Soft. BS +, no distension, tenderness, rebound or guarding.  Musculoskeletal: Normal range of motion. No edema and no tenderness.  LE: bilat/ no c/c/e, pulses 2+ bilateral. Lymphadenopathy: No lymphadenopathy noted, cervical Neuro: Alert.  muscle tone coordination. No cranial nerve deficit grossly. Skin: Skin is warm and dry. No rash noted. Not diaphoretic. No erythema. No pallor. Psychiatric: Normal mood and affect. Behavior, judgment, thought content normal.  Lab Results  Component Value Date   WBC 10.8* 10/20/2015   HGB 16.5 10/20/2015   HCT 44.6 10/20/2015   MCV 93.9 10/20/2015   PLT 300 10/20/2015   Lab Results  Component Value Date   CREATININE 0.44* 10/20/2015   BUN <5* 10/20/2015   NA 128* 10/20/2015   K 3.7 10/20/2015   CL 93* 10/20/2015   CO2 22 10/20/2015    No results found for: HGBA1C Lipid Panel  No results found for: CHOL, TRIG, HDL, CHOLHDL, VLDL, LDLCALC     Depression screen [facility] 2/9 10/26/2015  Decreased Interest 0  Down, Depressed, Hopeless 0  PHQ - 2 Score 0   Ct chest 10/20/15 IMPRESSION: 1. There are several suspicious nodules identified within the left lung which are worrisome for primary bronchogenic carcinoma. Further evaluation with PET-CT and tissue sampling if indicated. 2. Enlarged right paratracheal lymph node. In the setting of bronchogenic carcinoma this would be suspicious metastatic adenopathy 3. Emphysema 4. Aortic atherosclerosis and RCA coronary artery calcification.  Ct neck 10/20/15 IMPRESSION: 1. No mass or acute abnormality identified in the neck. 2. 1.6 cm cavitary lesion in the left lung apex. This may reflect a primary lung carcinoma or be infectious. Consider chest CT to evaluate the remainder of the lungs for other  lesions.  cxr 2v 10/20/15 COMPARISON: None.  FINDINGS: The chest is hyperexpanded with attenuation of the pulmonary vasculature. The lungs are clear. No pneumothorax or pleural effusion. No focal bony abnormality.  IMPRESSION: COPD without acute disease. Assessment and plan:   1. Hemoptysis in setting of Pulmonary nodules/lesions, multiple - concerning for malignancy, but certainly underlying infection possible. - being followed by Pulm, Bronch./BAL pending, per dgt planning to have it done in next 2-3 wks, pulm office to call her this week  2. HTN (hypertension), malignant - asx, no concerning sxs currently. - given pulmonology sxs, will avoid bb. - start on prinzide 20-'25mg'$  today, - f/u bp in 1-2 wks  3. tob abuse - congratulated him on cessation, keep it up  4. etoh abuse - now down to 4-6 beers daily, recd slow taper to prevent w/d/DTs.  5. Lipoma neck - appears benign on CT.  Return in about 1 week (around 11/02/2015). fu bp   The patient was given clear instructions to go to ER or return to medical center if symptoms don't improve, worsen or new problems develop. The patient verbalized understanding. The patient was told to call to get lab results if they haven't heard anything in the next  week.      Maren Reamer, MD, MBA/MHA [place], [place]   10/26/2015, 5:49 PM

## 2015-10-27 NOTE — Telephone Encounter (Signed)
Called and spoke with Daughter, she had already scheduled appointment with Dr. Stevenson Clinch and patient was seen on 10/23/15.   Nothing further needed.

## 2015-10-30 ENCOUNTER — Telehealth: Payer: Self-pay | Admitting: Cardiovascular Disease

## 2015-10-30 ENCOUNTER — Ambulatory Visit
Admission: RE | Admit: 2015-10-30 | Discharge: 2015-10-30 | Disposition: A | Discharge status: Home / Self Care | Payer: Self-pay | Source: Ambulatory Visit | Attending: Internal Medicine | Admitting: Internal Medicine

## 2015-10-30 ENCOUNTER — Encounter: Payer: Self-pay | Admitting: Internal Medicine

## 2015-10-30 DIAGNOSIS — R918 Other nonspecific abnormal finding of lung field: Secondary | ICD-10-CM | POA: Insufficient documentation

## 2015-10-30 DIAGNOSIS — Z01812 Encounter for preprocedural laboratory examination: Secondary | ICD-10-CM | POA: Insufficient documentation

## 2015-10-30 DIAGNOSIS — Z01810 Encounter for preprocedural cardiovascular examination: Secondary | ICD-10-CM | POA: Insufficient documentation

## 2015-10-30 HISTORY — DX: Unspecified convulsions: R56.9

## 2015-10-30 LAB — BASIC METABOLIC PANEL
Anion gap: 13 (ref 5–15)
BUN: 7 mg/dL (ref 6–20)
CO2: 20 mmol/L — ABNORMAL LOW (ref 22–32)
Calcium: 9.2 mg/dL (ref 8.9–10.3)
Chloride: 93 mmol/L — ABNORMAL LOW (ref 101–111)
Creatinine, Ser: 0.42 mg/dL — ABNORMAL LOW (ref 0.61–1.24)
GFR calc Af Amer: >60 mL/min (ref >60)
GFR calc non Af Amer: >60 mL/min (ref >60)
Glucose, Bld: 76 mg/dL (ref 65–99)
Potassium: 3.7 mmol/L (ref 3.5–5.1)
Sodium: 126 mmol/L — ABNORMAL LOW (ref 135–145)

## 2015-10-30 NOTE — Pre-Procedure Instructions (Signed)
MEDICAL CLEARANCE REQUEST/EKG AS INSTRUCTED BY DR VANSTAVEREN CALLED TO MISTY AT DR Mortimer Fries OFFICE

## 2015-10-30 NOTE — Telephone Encounter (Signed)
Cardiac clearance request for 5/11 bronch received. Pt has 5/9 appt w/Dr. Fletcher Anon.

## 2015-10-31 NOTE — Pre-Procedure Instructions (Signed)
Met B and CBC results sent to Anesthesia for review.

## 2015-11-03 ENCOUNTER — Encounter: Payer: Self-pay | Admitting: Cardiovascular Disease

## 2015-11-03 ENCOUNTER — Ambulatory Visit (INDEPENDENT_AMBULATORY_CARE_PROVIDER_SITE_OTHER): Payer: Self-pay | Admitting: Cardiovascular Disease

## 2015-11-03 VITALS — BP 140/58 | HR 84 | Ht 63.0 in | Wt 123.0 lb

## 2015-11-03 DIAGNOSIS — Z01810 Encounter for preprocedural cardiovascular examination: Secondary | ICD-10-CM | POA: Insufficient documentation

## 2015-11-03 DIAGNOSIS — R042 Hemoptysis: Secondary | ICD-10-CM

## 2015-11-03 DIAGNOSIS — Z720 Tobacco use: Secondary | ICD-10-CM

## 2015-11-03 DIAGNOSIS — I700 Atherosclerosis of aorta: Secondary | ICD-10-CM

## 2015-11-03 DIAGNOSIS — I2510 Atherosclerotic heart disease of native coronary artery without angina pectoris: Secondary | ICD-10-CM

## 2015-11-03 DIAGNOSIS — R0602 Shortness of breath: Secondary | ICD-10-CM

## 2015-11-03 DIAGNOSIS — R918 Other nonspecific abnormal finding of lung field: Secondary | ICD-10-CM

## 2015-11-03 NOTE — Progress Notes (Signed)
Patient ID: David Warren, male    DOB: Dec 11, 1955, 60 y.o.   MRN: [id number]  HPI Comments: David Warren is a 60 year old Hispanic male with long smoking hx, seen by pulmonary, diagnosis of hemoptysis over the past several weeks, CT scan that [facility] showing 3 dominant left-sided pulmonary lesions concerning for malignancy and enlarged right paratracheal lymph node, incidental finding of aortic atherosclerosis and coronary atherosclerosis of the right coronary artery presents by referral by Dr. Stevenson Clinch  for consultation for his coronary atherosclerosis and preoperative evaluation.  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 He did not appear to have significant left main or LAD even left circumflex  Calcified coronary atherosclerosis  EKG on today's visit shows normal sinus rhythm with rate 84 bpm, no significant ST or T-wave abnormality    Allergies  Allergen Reactions  . Bee Venom Anaphylaxis    Current Outpatient Prescriptions on File Prior to Visit  Medication Sig Dispense Refill  . ibuprofen (ADVIL,MOTRIN) 200 MG tablet Take 400 mg by mouth every 6 (six) hours as needed (pain).    Marland Kitchen lisinopril-hydrochlorothiazide (PRINZIDE,ZESTORETIC) 20-25 MG tablet Take 1 tablet by mouth daily.     No current facility-administered medications on file prior to visit.    Past  Medical History  Diagnosis Date  . Tobacco abuse   . Lipoma of neck   . Hypertension   . Seizures ([place])     once several years ago    Past Surgical History  Procedure Laterality Date  . No past surgeries      Social History  reports that he quit smoking 11 days ago. His smoking use included Cigarettes. He has a 35 pack-year smoking history. He has never used smokeless tobacco. He reports that he drinks about 7.2 oz of alcohol per week. He reports that he does not use illicit drugs.  Family History Family history is unknown by patient.  Review of Systems  Constitutional: Negative.   HENT: Negative.   Eyes: Negative.   Respiratory: Negative.        Hemoptysis  Cardiovascular: Negative.   Gastrointestinal: Negative.   Endocrine: Negative.   Genitourinary: Positive for scrotal swelling.  Musculoskeletal: Negative.   Skin: Negative.   Allergic/Immunologic: Negative.   Neurological: Negative.   Hematological: Negative.   Psychiatric/Behavioral: Negative.   All other systems reviewed and are negative.   BP 140/58 mmHg  Pulse 84  Ht '5\' 3"'$  (1.6 m)  Wt 123 lb (55.792 kg)  BMI 21.79 kg/m2   Physical Exam  Constitutional: He is oriented to person, place, and time. He appears well-developed and well-nourished.  Thin  HENT:  Head: Normocephalic.  Nose: Nose normal.  Mouth/Throat: Oropharynx is clear and moist.  Eyes: Conjunctivae are normal. Pupils are equal, round, and reactive to light.  Neck: Normal range of motion. Neck supple. No JVD present.  Cardiovascular: Normal rate, regular rhythm,  normal heart sounds and intact distal pulses.  Exam reveals no gallop and no friction rub.   No murmur heard. Pulmonary/Chest: Effort normal. No respiratory distress. He has decreased breath sounds. He has no wheezes. He has no rales. He exhibits no tenderness.  Abdominal: Soft. Bowel sounds are normal. He exhibits no distension. There is no tenderness.  Musculoskeletal: Normal range  of motion. He exhibits no edema or tenderness.  Lymphadenopathy:    He has no cervical adenopathy.  Neurological: He is alert and oriented to person, place, and time. Coordination normal.  Skin: Skin is warm and dry. No rash noted. No erythema.  Psychiatric: He has a normal mood and affect. His behavior is normal. Judgment and thought content normal.

## 2015-11-03 NOTE — Telephone Encounter (Signed)
Faxed anesthesia request for clearance for May 11 bronch to PAT, [phone]

## 2015-11-03 NOTE — Assessment & Plan Note (Signed)
Acceptable risk for upcoming bronchoscopy No anginal symptoms, mild aortic atherosclerosis, appears to have mild calcification in the right coronary artery, proximal region. Does not appear to have significant calcified atherosclerosis in the left main, LAD or left circumflex. Recommended that he take his blood pressure pill morning of the procedure

## 2015-11-03 NOTE — Assessment & Plan Note (Signed)
Recent symptoms of hemoptysis leading to CT scan, diagnosis of lung nodules Bronchoscopy scheduled for May 11

## 2015-11-03 NOTE — Assessment & Plan Note (Signed)
Nodules concerning for neoplasm Scheduled for bronchoscopy

## 2015-11-03 NOTE — Patient Instructions (Signed)
You are doing well. No medication changes were made.  Please have primary care check cholesterol Goal total choelsterol <150  Please call us if you have new issues that need to be addressed before your next appt.  Your physician wants you to follow-up in: 12 months.  You will receive a reminder letter in the mail two months in advance. If you don't receive a letter, please call our office to schedule the follow-up appointment.

## 2015-11-03 NOTE — Assessment & Plan Note (Signed)
Reports that he stop smoking 8 days ago  He  does not want any assistance with chantix.

## 2015-11-03 NOTE — Assessment & Plan Note (Signed)
Mild diffuse aortic atherosclerosis seen on CT scan Likely secondary to long history of smoking Images discussed with him in detail

## 2015-11-03 NOTE — Assessment & Plan Note (Signed)
Coronary calcification seen in the right coronary artery Currently with no symptoms of angina No further testing needed prior to bronchoscopy

## 2015-11-04 NOTE — Pre-Procedure Instructions (Signed)
Cardiac clearance on chart.

## 2015-11-05 ENCOUNTER — Ambulatory Visit: Payer: Self-pay | Admitting: Certified Registered"

## 2015-11-05 ENCOUNTER — Ambulatory Visit
Admission: RE | Admit: 2015-11-05 | Discharge: 2015-11-05 | Disposition: A | Discharge status: Home / Self Care | Payer: Self-pay | Source: Ambulatory Visit | Attending: Internal Medicine | Admitting: Internal Medicine

## 2015-11-05 ENCOUNTER — Ambulatory Visit: Payer: Self-pay

## 2015-11-05 ENCOUNTER — Encounter
Admission: RE | Disposition: A | Discharge status: Home / Self Care | Payer: Self-pay | Source: Ambulatory Visit | Attending: Internal Medicine

## 2015-11-05 ENCOUNTER — Encounter: Payer: Self-pay | Admitting: *Deleted

## 2015-11-05 DIAGNOSIS — R05 Cough: Secondary | ICD-10-CM | POA: Insufficient documentation

## 2015-11-05 DIAGNOSIS — R634 Abnormal weight loss: Secondary | ICD-10-CM | POA: Insufficient documentation

## 2015-11-05 DIAGNOSIS — J439 Emphysema, unspecified: Secondary | ICD-10-CM | POA: Insufficient documentation

## 2015-11-05 DIAGNOSIS — I700 Atherosclerosis of aorta: Secondary | ICD-10-CM | POA: Insufficient documentation

## 2015-11-05 DIAGNOSIS — R591 Generalized enlarged lymph nodes: Secondary | ICD-10-CM

## 2015-11-05 DIAGNOSIS — I2510 Atherosclerotic heart disease of native coronary artery without angina pectoris: Secondary | ICD-10-CM | POA: Insufficient documentation

## 2015-11-05 DIAGNOSIS — C3432 Malignant neoplasm of lower lobe, left bronchus or lung: Secondary | ICD-10-CM | POA: Insufficient documentation

## 2015-11-05 DIAGNOSIS — F17200 Nicotine dependence, unspecified, uncomplicated: Secondary | ICD-10-CM | POA: Insufficient documentation

## 2015-11-05 DIAGNOSIS — Z79899 Other long term (current) drug therapy: Secondary | ICD-10-CM | POA: Insufficient documentation

## 2015-11-05 DIAGNOSIS — I10 Essential (primary) hypertension: Secondary | ICD-10-CM | POA: Insufficient documentation

## 2015-11-05 DIAGNOSIS — R042 Hemoptysis: Secondary | ICD-10-CM | POA: Insufficient documentation

## 2015-11-05 DIAGNOSIS — R918 Other nonspecific abnormal finding of lung field: Secondary | ICD-10-CM | POA: Insufficient documentation

## 2015-11-05 DIAGNOSIS — C771 Secondary and unspecified malignant neoplasm of intrathoracic lymph nodes: Secondary | ICD-10-CM | POA: Insufficient documentation

## 2015-11-05 DIAGNOSIS — R599 Enlarged lymph nodes, unspecified: Secondary | ICD-10-CM | POA: Insufficient documentation

## 2015-11-05 HISTORY — PX: ENDOBRONCHIAL ULTRASOUND: SHX5096

## 2015-11-05 HISTORY — PX: ELECTROMAGNETIC NAVIGATION BROCHOSCOPY: SHX5369

## 2015-11-05 SURGERY — ELECTROMAGNETIC NAVIGATION BRONCHOSCOPY
Anesthesia: General

## 2015-11-05 MED ORDER — SUCCINYLCHOLINE CHLORIDE 20 MG/ML IJ SOLN
INTRAMUSCULAR | Status: DC | PRN
  Administered 2015-11-05: 100 mg via INTRAVENOUS

## 2015-11-05 MED ORDER — LACTATED RINGERS IV SOLN
INTRAVENOUS | Status: DC
  Administered 2015-11-05: 13:00:00 via INTRAVENOUS

## 2015-11-05 MED ORDER — ROCURONIUM BROMIDE 100 MG/10ML IV SOLN
INTRAVENOUS | Status: DC | PRN
  Administered 2015-11-05: 25 mg via INTRAVENOUS
  Administered 2015-11-05: 5 mg via INTRAVENOUS
  Administered 2015-11-05: 20 mg via INTRAVENOUS
  Administered 2015-11-05: 10 mg via INTRAVENOUS

## 2015-11-05 MED ORDER — FAMOTIDINE 20 MG PO TABS
20.0000 mg | ORAL_TABLET | Freq: Once | ORAL | Status: AC
  Administered 2015-11-05: 20 mg via ORAL

## 2015-11-05 MED ORDER — PROPOFOL 10 MG/ML IV BOLUS
INTRAVENOUS | Status: DC | PRN
  Administered 2015-11-05: 170 mg via INTRAVENOUS

## 2015-11-05 MED ORDER — ONDANSETRON HCL 4 MG/2ML IJ SOLN
INTRAMUSCULAR | Status: DC | PRN
  Administered 2015-11-05: 4 mg via INTRAVENOUS

## 2015-11-05 MED ORDER — FENTANYL CITRATE (PF) 100 MCG/2ML IJ SOLN: 25.0000 ug | INTRAMUSCULAR | Status: DC | PRN

## 2015-11-05 MED ORDER — ONDANSETRON HCL 4 MG/2ML IJ SOLN: 4.0000 mg | Freq: Once | INTRAMUSCULAR | Status: DC | PRN

## 2015-11-05 MED ORDER — MIDAZOLAM HCL 2 MG/2ML IJ SOLN
INTRAMUSCULAR | Status: DC | PRN
  Administered 2015-11-05 (×2): 1 mg via INTRAVENOUS

## 2015-11-05 MED ORDER — FAMOTIDINE 20 MG PO TABS
ORAL_TABLET | ORAL | Status: AC
  Administered 2015-11-05: 20 mg via ORAL
  Filled 2015-11-05: qty 1

## 2015-11-05 MED ORDER — LIDOCAINE HCL (CARDIAC) 20 MG/ML IV SOLN
INTRAVENOUS | Status: DC | PRN
  Administered 2015-11-05: 60 mg via INTRAVENOUS

## 2015-11-05 MED ORDER — SUGAMMADEX SODIUM 200 MG/2ML IV SOLN
INTRAVENOUS | Status: DC | PRN
  Administered 2015-11-05: 110 mg via INTRAVENOUS

## 2015-11-05 MED ORDER — DEXAMETHASONE SODIUM PHOSPHATE 10 MG/ML IJ SOLN
INTRAMUSCULAR | Status: DC | PRN
  Administered 2015-11-05: 10 mg via INTRAVENOUS

## 2015-11-05 MED ORDER — FENTANYL CITRATE (PF) 100 MCG/2ML IJ SOLN
INTRAMUSCULAR | Status: DC | PRN
  Administered 2015-11-05 (×3): 50 ug via INTRAVENOUS

## 2015-11-05 MED ORDER — EPHEDRINE SULFATE 50 MG/ML IJ SOLN
INTRAMUSCULAR | Status: DC | PRN
  Administered 2015-11-05 (×3): 5 mg via INTRAVENOUS

## 2015-11-05 MED ORDER — PHENYLEPHRINE HCL 10 MG/ML IJ SOLN
INTRAMUSCULAR | Status: DC | PRN
  Administered 2015-11-05 (×3): 100 ug via INTRAVENOUS

## 2015-11-05 NOTE — OR Nursing (Signed)
Spoke with Dr Mortimer Fries. Dr. Mortimer Fries to make referral to oncology for oncologist and PET Scan. Will let pt know about appts.

## 2015-11-05 NOTE — Interval H&P Note (Signed)
History and Physical Interval Note:  11/05/2015 1:04 PM  David Warren  has presented today for surgery, with the diagnosis of LEFT LUNG MASS  The various methods of treatment have been discussed with the patient and family. After consideration of risks, benefits and other options for treatment, the patient has consented to  Procedure(s): ELECTROMAGNETIC NAVIGATION BRONCHOSCOPY (N/A) ENDOBRONCHIAL ULTRASOUND (N/A) as a surgical intervention .  The patient's history has been reviewed, patient examined, no change in status, stable for surgery.  I have reviewed the patient's chart and labs.  Questions were answered to the patient's satisfaction.     Flora Lipps

## 2015-11-05 NOTE — Op Note (Signed)
PROCEDURE: ENDOBRONCHIAL ULTRASOUND   PROCEDURE DATE: 11/05/2015  TIME:  NAME:  David Warren  DOB:04-Jul-1955  MRN: [id number] LOC:  ARPO/None    HOSP DAY: '@LENGTHOFSTAYDAYS'$ @ CODE STATUS:   Code Status History    This patient does not have a recorded code status. Please follow your organizational policy for patients in this situation.          Indications/Preliminary Diagnosis:   Consent: (Place X beside choice/s below)  The benefits, risks and possible complications of the procedure were        explained to:  _x__ patient  _x__ patient's family  ___ other:___________  who verbalized understanding and gave:  ___ verbal  ___ written  _x__ verbal and written  ___ telephone  ___ other:________ consent.      Unable to obtain consent; procedure performed on emergent basis.     Other:   Spanish Interpreter present.    PRESEDATION ASSESSMENT: History and Physical has been performed. Patient meds and allergies have been reviewed. Presedation airway examination has been performed and documented. Baseline vital signs, sedation score, oxygenation status, and cardiac rhythm were reviewed. Patient was deemed to be in satisfactory condition to undergo the procedure.    PREMEDICATIONS: SEE ANESTHESIOLOGY RECORDS   Airway Prep (Place X beside choice below)   1% Transtracheal Lidocaine Anesthetization 7 cc   Patient prepped per Bronchoscopy Lab Policy       Insertion Route (Place X beside choice below)   Nasal   Oral  x Endotracheal Tube   Tracheostomy   INTRAPROCEDURE MEDICATIONS: SEE ANESTHESIOLOGY RECORDS   PROCEDURE DETAILS: Timeout performed and correct patient, name, & ID confirmed. Following prep per Pulmonary policy, appropriate sedation was administered.  Airway exam proceeded with findings, technical procedures, and specimen collection as noted below. At the end of exam the scope was withdrawn without incident. Impression and Plan as noted below.       TECHNICAL  PROCEDURES: (Place X beside choice below)   Procedures  Description    None     Electrocautery     Cryotherapy     Balloon Dilatation     Bronchography     Stent Placement     Therapeutic Aspiration     Laser/Argon Plasma    Brachytherapy Catheter Placement    Foreign Body Removal     SPECIMENS (Sites): (Place X beside choice below)  Specimens Description   No Specimens Obtained     Washings    Lavage    Biopsies   x Fine Needle Aspirates 6 samples were taken from large RT paratracheal LN approx 3x4 cm   Brushings    Sputum    FINDINGS: large RT paratracheal LN ESTIMATED BLOOD LOSS: none COMPLICATIONS/RESOLUTION: none   IMPRESSION:POST-PROCEDURE DX: possible mailignancy    RECOMMENDATION/PLAN: follow up pathology results     Corrin Parker, M.D.  [facility]  Medical Director [place] Director [place] Department

## 2015-11-05 NOTE — Discharge Instructions (Signed)
Flexible Bronchoscopy, Care After Refer to this sheet in the next few weeks. These instructions provide you with information on caring for yourself after your procedure. Your health care provider may also give you more specific instructions. Your treatment has been planned according to current medical practices, but problems sometimes occur. Call your health care provider if you have any problems or questions after your procedure.  WHAT TO EXPECT AFTER THE PROCEDURE It is normal to have the following symptoms for 24-48 hours after the procedure:   Increased cough.  Low-grade fever.  Sore throat or hoarse voice.  Small streaks of blood in your thick spit (sputum) if tissue samples were taken (biopsy). HOME CARE INSTRUCTIONS   Do not eat or drink anything for 2 hours after your procedure. Your nose and throat were numbed by medicine. If you try to eat or drink before the medicine wears off, food or drink could go into your lungs or you could burn yourself. After the numbness is gone and your cough and gag reflexes have returned, you may eat soft food and drink liquids slowly.   The day after the procedure, you can go back to your normal diet.   You may resume normal activities.   Keep all follow-up visits as directed by your health care provider. It is important to keep all your appointments, especially if tissue samples were taken for testing (biopsy). SEEK IMMEDIATE MEDICAL CARE IF:   You have increasing shortness of breath.   You become light-headed or faint.   You have chest pain.   You have any new concerning symptoms.  You cough up more than a small amount of blood.  The amount of blood you cough up increases. MAKE SURE YOU:  Understand these instructions.  Will watch your condition.  Will get help right away if you are not doing well or get worse.   This information is not intended to replace advice given to you by your health care provider. Make sure you discuss  any questions you have with your health care provider.   Document Released: 12/31/2004 Document Revised: 07/04/2014 Document Reviewed: 02/15/2013 Elsevier Interactive Patient Education 2016 [place]   1) The drugs that you were given will stay in your system until tomorrow so for the next 24 hours you should not:  A) Drive an automobile B) Make any legal decisions C) Drink any alcoholic beverage   2) You may resume regular meals tomorrow.  Today it is better to start with liquids and gradually work up to solid foods.  You may eat anything you prefer, but it is better to start with liquids, then soup and crackers, and gradually work up to solid foods.   3) Please notify your doctor immediately if you have any unusual bleeding, trouble breathing, redness and pain at the surgery site, drainage, fever, or pain not relieved by medication.    4) Additional Instructions:        Please contact your physician with any problems or Same Day Surgery at [phone], Monday through Friday 6 am to 4 pm, or [place] at [facility] number at [phone].

## 2015-11-05 NOTE — H&P (View-Only) (Signed)
[name]    Date: 10/23/2015  MRN# [id number] David Warren [birth year]  Referring Physician: self referral PMD -  David Warren is a 60 y.o. old male seen in consultation for abnormal CT Chest Scan  CC:  Chief Complaint  Patient presents with  . pulmonary consult    self ref. pt c/o coughing up blood went to [place]. CT showed 4 spots on lung. c/o occ cough up blood X3wk. denies SOB, wheezing or cp/tightness.     HPI:  Patient is a 60 year old Hispanic male presenting as a self-referral for increasing hemoptysis over the last 2-3 weeks. He is accompanied by [place] interpreter and wife and daughter today. Through interpreter and daughter, patient's been having increased hemoptysis over the past 2-3 weeks. They presented to [facility] earlier this week, with a CT scan was obtained that showed he had 3 dominant left sided pulmonary lesions that were concerning for malignancy along with an enlarged right paratracheal lymph node. He is a current smoker, pack a day for about 53 years, he also drinks alcohol on a daily basis 6-8 cans of beer. Prior to this episode he had no other previous episodes of hemoptysis. He does not endorse any night sweats, but does state he's had about a 16 pound weight loss since February. He has significant lipoma on the posterior neck which has been told by other physicians to family that it is benign and surgery is elective as long as it is not impinging on any major structures. Hemoptysis is described as intermittent, sometimes frank blood mixed with sputum, sometimes speckled blood in sputum. It is not more than half a cup to a cup per day, and it is not every day. 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  PMHX:   Past Medical History  Diagnosis Date  . Hypertension   . Tobacco abuse   . Lipoma of neck    Surgical Hx:  History reviewed. No pertinent past surgical history. Family  Hx:  Family History  Problem Relation Age of Onset  . Family history unknown: Yes   Social Hx:   Social History  Substance Use Topics  . Smoking status: Current Every Day Smoker -- 1.00 packs/day for 53 years  . Smokeless tobacco: None  . Alcohol Use: 7.2 oz/week    12 Cans of beer per week   Medication:   Current Outpatient Rx  Name  Route  Sig  Dispense  Refill  . ibuprofen (ADVIL,MOTRIN) 200 MG tablet   Oral   Take 400 mg by mouth every 6 (six) hours as needed (pain).             Allergies:  Review of patient's allergies indicates no known allergies.  Review of Systems  Constitutional: Negative for fever and chills.  Eyes: Negative for blurred vision.  Respiratory: Positive for cough, hemoptysis and shortness of breath.   Gastrointestinal: Negative for heartburn, nausea, vomiting, abdominal pain and blood in stool.  Genitourinary: Negative for dysuria.  Musculoskeletal: Negative for falls.  Neurological: Negative for dizziness and headaches.  Endo/Heme/Allergies: Does not bruise/bleed easily.  Psychiatric/Behavioral: Negative for depression.     Physical Examination:   VS: BP 142/82 mmHg  Pulse 106  Ht '5\' 3"'$  (1.6 m)  Wt 118 lb 9.6 oz (53.797 kg)  BMI 21.01 kg/m2  SpO2 97%  General Appearance: No distress  Neuro:without focal findings, mental status, speech normal, alert and oriented, cranial nerves  2-12 intact, reflexes normal and symmetric, sensation grossly normal  HEENT: PERRLA, EOM intact, no ptosis, lipoma of posterior neck (soft, non tender).  Pulmonary: normal breath sounds., diaphragmatic excursion normal.No wheezing, No rales;   Sputum Production:   CardiovascularNormal S1,S2.  No m/r/g.  Abdominal aorta pulsation normal.    Abdomen: Benign, Soft, non-tender, No masses, hepatosplenomegaly, No lymphadenopathy Renal:  No costovertebral tenderness  GU:  No performed at this time. Endoc: No evident thyromegaly, no signs of acromegaly or Cushing  features Skin:   warm, no rashes, no ecchymosis  Extremities: normal, no cyanosis, clubbing, no edema, warm with normal capillary refill. Other findings:none    Rad results: (The following images and results were reviewed by Dr. Stevenson Clinch on 10/23/2015). CT Chest 10/20/15 Multidetector CT imaging of the chest was performed following the standard protocol without IV contrast.  COMPARISON: None  FINDINGS: Mediastinum: Normal heart size. Aortic atherosclerosis is identified. Calcification within the RCA coronary artery is identified. 1.4 cm right paratracheal lymph node is identified, image 40 of series 2. No supraclavicular or axillary adenopathy.  Lungs/Pleura: No pleural fluid identified. Moderate changes of centrilobular emphysema. There is a cavitary part solid nodule within the left apex anteriorly measuring 2.3 cm, image 17 of series 5. Within the superior segment of left lower lobe there is a spiculated nodule which measures 2.1 cm, image 50 of series 5. Small part solid nodule within the left upper lobe is identified measuring 1.3 cm, image 44 of series 5.  Upper Abdomen: Left adrenal adenoma is identified measuring 1.8 cm, image 100 of series 2. Right adrenal gland is normal. No focal liver abnormality identified the visualized portions of the spleen are normal.  Musculoskeletal: Multi level spondylosis is present within the thoracic spine. No aggressive lytic or sclerotic bone lesions. Chronic left lateral rib fracture deformities are identified.  IMPRESSION: 1. There are several suspicious nodules identified within the left lung which are worrisome for primary bronchogenic carcinoma. Further evaluation with PET-CT and tissue sampling if indicated. 2. Enlarged right paratracheal lymph node. In the setting of bronchogenic carcinoma this would be suspicious metastatic adenopathy 3. Emphysema 4. Aortic atherosclerosis and RCA coronary artery calcification.      Assessment and Plan: 60 year old male tobacco use, now with left-sided pulmonary lesions suspicious for malignancy, seen in office today for further workup and evaluation. Pulmonary nodules/lesions, multiple Left-sided pulmonary lesions concerning for malignancy,  Risk factors include prolonged smoking history, age, alcohol use, and and overall characteristics (spiculated, cavitary, upper lobe) of the lesions. Differentials include: Infection, fungal infection, malignancy, vasculitis  Most likely this is either a malignancy or infection. No significant risk factors for TB.  Plan: -Electronic navigational bronchoscopy -Bronchoscopy via EBUS -BAL with bronchoscopy for AFB, fungus, virus, microbiology, cytology -Stop smoking 2 weeks prior to scheduled procedure  Tobacco abuse Tobacco Cessation - Counseling regarding benefits of smoking cessation strategies was provided for more than 12 min. - Educated that at this time smoking- cessation represents the single most important step that patient can take to enhance the length and quality of live. - Educated patient regarding alternatives of behavior interventions, pharmacotherapy including NRT and non-nicotine therapy such, and combinations of both. - Patient at this time: Quit on his own   Hemoptysis Differential diagnosis includes: Infection (fungal versus microbial), malignancy No frank hemoptysis noted Intermittent hemoptysis, with abnormal chest CT findings of left-sided pulmonary lesions one with cavitary appearance. No fevers, no chills, no weakness, no significant findings to suggest active infection, will hold off  on antibiotic therapy at this time until a more accurate yield can be obtained from BAL during bronchoscopy.   Plan: -Bronchoscopy with BAL -See plan for pulmonary lesions    Updated Medication List Outpatient Encounter Prescriptions as of 10/23/2015  Medication Sig  . ibuprofen (ADVIL,MOTRIN) 200 MG tablet Take  400 mg by mouth every 6 (six) hours as needed (pain).  . [DISCONTINUED] HYDROcodone-acetaminophen (NORCO/VICODIN) 5-325 MG per tablet Take 1 tablet by mouth every 6 (six) hours as needed. (Patient not taking: Reported on 10/20/2015)   No facility-administered encounter medications on file as of 10/23/2015.    Orders for this visit: No orders of the defined types were placed in this encounter.     Thank  you for the consultation and for allowing [facility] to assist in the care of your patient. Our recommendations are noted above.  Please contact us if we can be of further service.   Vilinda Boehringer, MD [facility] Office Number: [phone]  Note: This note was prepared with Dragon dictation along with smaller phrase technology. Any transcriptional errors that result from this process are unintentional.

## 2015-11-05 NOTE — Anesthesia Procedure Notes (Signed)
Procedure Name: Intubation Date/Time: 11/05/2015 1:13 PM Performed by: Silvana Newness Pre-anesthesia Checklist: Patient identified, Emergency Drugs available, Suction available, Patient being monitored and Timeout performed Patient Re-evaluated:Patient Re-evaluated prior to inductionOxygen Delivery Method: Circle system utilized Preoxygenation: Pre-oxygenation with 100% oxygen Intubation Type: IV induction Ventilation: Mask ventilation without difficulty Laryngoscope Size: Mac and 4 Grade View: Grade II Tube type: Oral Tube size: 8.5 mm Number of attempts: 1 Airway Equipment and Method: Rigid stylet Placement Confirmation: ETT inserted through vocal cords under direct vision,  positive ETCO2 and breath sounds checked- equal and bilateral Secured at: 21 cm Tube secured with: Tape Dental Injury: Teeth and Oropharynx as per pre-operative assessment

## 2015-11-05 NOTE — Op Note (Signed)
Electromagnetic Navigation Bronchoscopy: Indication: lung mass  Preoperative Diagnosis:lung mass Post Procedure Diagnosis: lung mass with adenopathy Consent: verbal/written Risks and benefits explained in detail including risk of infection, bleeding, respiratory failure and death.   Hand washing performed prior to starting the procedure.   Type of Anesthesia: see Anesthesiology records .   Procedure Performed:  Virtual Bronchoscopy with Multi-planar Image analysis, 3-D reconstruction of coronal, sagittal and multi-planar images for the purposes of planning real-time bronchoscopy using the iLogic Electromagnetic Navigation Bronchoscopy System (superDimension)..   Description of Procedure: After obtaining informed consent from the patient, the above sedative and anesthetic measures were carried out, flexible fiberoptic bronchoscope was inserted via an oral bite block. Posterior pharynx was clear. The 2 vocal cords were easily traversed after application of local anesthetic.  The virtual camera was then placed into the central portion of the trachea. The trachea itself was inspected.  The main carina, right and left midstem bronchus and all the segmental and subsegmental airways by virtual bronchoscopy were brieftly inspected.  The camera was directed to standard registration points at the following centers: main carina, right upper lobe bronchus, right lower lobe bronchus, right middle lobe bronchus, left upper lobe bronchus, and the left lower lobe bronchus. This data was transferred to the i-Logic ENB system for real-time bronchoscopy.   I was able to Navigate to Left Lower lobe mass where samples were taken.    Specimans Obtained:  Fine Needle Aspirations 21G times:1 Fine Needle Aspiration: Gen CUT x4  Triple Needle Brush:1  Single Needle Brush:3    Fluoroscopy:  Fluoroscopy was utilized during the course of this procedure to assure that biopsies were taken in a safe manner under  fluoroscopic guidance with no spot films required.   Complications:none Estimated Blood Loss: none  Monitoring:  The patient was monitored with continuous oximetry and received supplemental nasal cannula oxygen throughout the procedure. In addition, serial blood pressure measurements and continuous electrocardiography showed these physiologic parameters to remain tolerable throughout the procedure.   Assessment and Plan/Additional Comments:follow up pathology reports   Corrin Parker, M.D.  [facility]  Medical Director [place] Director [facility] Cardio-Pulmonary Department

## 2015-11-05 NOTE — Anesthesia Preprocedure Evaluation (Signed)
Anesthesia Evaluation  Patient identified by MRN, date of birth, ID band Patient awake    Reviewed: Allergy & Precautions, NPO status , Patient's Chart, lab work & pertinent test results  Airway Mallampati: III       Dental  (+) Teeth Intact   Pulmonary COPD, Current Smoker,     + decreased breath sounds      Cardiovascular Exercise Tolerance: Good hypertension, Pt. on medications + CAD   Rhythm:Regular     Neuro/Psych Seizures -,     GI/Hepatic negative GI ROS, (+)     substance abuse  alcohol use,   Endo/Other  negative endocrine ROS  Renal/GU negative Renal ROS     Musculoskeletal   Abdominal   Peds  Hematology negative hematology ROS (+)   Anesthesia Other Findings   Reproductive/Obstetrics                             Anesthesia Physical Anesthesia Plan  ASA: III  Anesthesia Plan: General   Post-op Pain Management:    Induction: Intravenous  Airway Management Planned: Oral ETT  Additional Equipment:   Intra-op Plan:   Post-operative Plan: Extubation in OR  Informed Consent: I have reviewed the patients History and Physical, chart, labs and discussed the procedure including the risks, benefits and alternatives for the proposed anesthesia with the patient or authorized representative who has indicated his/her understanding and acceptance.     Plan Discussed with: CRNA  Anesthesia Plan Comments:         Anesthesia Quick Evaluation

## 2015-11-05 NOTE — Transfer of Care (Signed)
Immediate Anesthesia Transfer of Care Note  Patient: David Warren  Procedure(s) Performed: Procedure(s): ELECTROMAGNETIC NAVIGATION BRONCHOSCOPY (N/A) ENDOBRONCHIAL ULTRASOUND (N/A)  Patient Location: PACU  Anesthesia Type:General  Level of Consciousness: awake, alert , oriented and patient cooperative  Airway & Oxygen Therapy: Patient Spontanous Breathing and Patient connected to face mask oxygen  Post-op Assessment: Report given to RN, Post -op Vital signs reviewed and stable and Patient moving all extremities X 4  Post vital signs: Reviewed and stable  Last Vitals:  Filed Vitals:   11/05/15 1233 11/05/15 1500  BP: 177/59 181/66  Pulse: 89 86  Temp: 37.1 C 36.3 C  Resp: 16 15    Last Pain:  Filed Vitals:   11/05/15 1502  PainSc: 0-No pain         Complications: No apparent anesthesia complications

## 2015-11-06 ENCOUNTER — Encounter: Payer: Self-pay | Admitting: Internal Medicine

## 2015-11-06 LAB — CYTOLOGY - NON PAP

## 2015-11-07 NOTE — Anesthesia Postprocedure Evaluation (Signed)
Anesthesia Post Note  Patient: David Warren  Procedure(s) Performed: Procedure(s) (LRB): ELECTROMAGNETIC NAVIGATION BRONCHOSCOPY (N/A) ENDOBRONCHIAL ULTRASOUND (N/A)  Patient location during evaluation: PACU Anesthesia Type: General Level of consciousness: awake Pain management: pain level controlled Vital Signs Assessment: post-procedure vital signs reviewed and stable Respiratory status: spontaneous breathing Cardiovascular status: blood pressure returned to baseline Anesthetic complications: no    Last Vitals:  Filed Vitals:   11/05/15 1546 11/05/15 1630  BP:  162/62  Pulse: 85 87  Temp: 36.7 C 36.6 C  Resp: 12 16    Last Pain:  Filed Vitals:   11/06/15 1035  PainSc: 0-No pain                 VAN STAVEREN,Aiana Nordquist

## 2015-11-09 ENCOUNTER — Telehealth: Payer: Self-pay | Admitting: *Deleted

## 2015-11-09 ENCOUNTER — Encounter: Payer: Self-pay | Admitting: *Deleted

## 2015-11-09 ENCOUNTER — Ambulatory Visit: Payer: Self-pay | Admitting: Internal Medicine

## 2015-11-09 DIAGNOSIS — C801 Malignant (primary) neoplasm, unspecified: Secondary | ICD-10-CM

## 2015-11-09 NOTE — Telephone Encounter (Signed)
Per DK, ref pt to oncology. Oncology referral placed.

## 2015-11-09 NOTE — Telephone Encounter (Signed)
Oncology Nurse Navigator Documentation  Oncology Nurse Navigator Flowsheets 11/09/2015  Navigator Encounter Type Telephone  Telephone Outgoing Call  Treatment Phase Pre-Tx/Tx Discussion  Barriers/Navigation Needs Coordination of Care  Interventions Coordination of Care  Acuity Level 1  Time Spent with Patient 30   Called to schedule appt with Dr. Julien Nordmann on 11/13/15 arrive at 9:30

## 2015-11-12 ENCOUNTER — Other Ambulatory Visit: Payer: Self-pay | Admitting: Medical Oncology

## 2015-11-12 DIAGNOSIS — R918 Other nonspecific abnormal finding of lung field: Secondary | ICD-10-CM

## 2015-11-13 ENCOUNTER — Other Ambulatory Visit: Payer: Self-pay

## 2015-11-13 ENCOUNTER — Ambulatory Visit (HOSPITAL_BASED_OUTPATIENT_CLINIC_OR_DEPARTMENT_OTHER): Payer: Self-pay | Admitting: Internal Medicine

## 2015-11-13 ENCOUNTER — Encounter: Payer: Self-pay | Admitting: Internal Medicine

## 2015-11-13 ENCOUNTER — Telehealth: Payer: Self-pay | Admitting: Internal Medicine

## 2015-11-13 ENCOUNTER — Telehealth: Payer: Self-pay | Admitting: *Deleted

## 2015-11-13 ENCOUNTER — Other Ambulatory Visit (HOSPITAL_BASED_OUTPATIENT_CLINIC_OR_DEPARTMENT_OTHER): Payer: Self-pay

## 2015-11-13 VITALS — BP 164/55 | HR 99 | Temp 98.4°F | Resp 17 | Ht 63.0 in | Wt 121.3 lb

## 2015-11-13 DIAGNOSIS — R042 Hemoptysis: Secondary | ICD-10-CM

## 2015-11-13 DIAGNOSIS — R918 Other nonspecific abnormal finding of lung field: Secondary | ICD-10-CM

## 2015-11-13 DIAGNOSIS — C3492 Malignant neoplasm of unspecified part of left bronchus or lung: Secondary | ICD-10-CM

## 2015-11-13 DIAGNOSIS — C3432 Malignant neoplasm of lower lobe, left bronchus or lung: Secondary | ICD-10-CM

## 2015-11-13 DIAGNOSIS — Z87891 Personal history of nicotine dependence: Secondary | ICD-10-CM

## 2015-11-13 HISTORY — DX: Malignant neoplasm of unspecified part of left bronchus or lung: C34.92

## 2015-11-13 LAB — CBC WITH DIFFERENTIAL/PLATELET
BASO%: 0.9 % (ref 0.0–2.0)
Basophils Absolute: 0.1 10*3/uL (ref 0.0–0.1)
EOS%: 0.8 % (ref 0.0–7.0)
Eosinophils Absolute: 0.1 10*3/uL (ref 0.0–0.5)
HCT: 38.9 % (ref 38.4–49.9)
HGB: 13.6 g/dL (ref 13.0–17.1)
LYMPH#: 1.9 10*3/uL (ref 0.9–3.3)
LYMPH%: 14.2 % (ref 14.0–49.0)
MCH: 33.5 pg — ABNORMAL HIGH (ref 27.2–33.4)
MCHC: 35 g/dL (ref 32.0–36.0)
MCV: 95.8 fL (ref 79.3–98.0)
MONO#: 1.5 10*3/uL — AB (ref 0.1–0.9)
MONO%: 11 % (ref 0.0–14.0)
NEUT%: 73.1 % (ref 39.0–75.0)
NEUTROS ABS: 9.9 10*3/uL — AB (ref 1.5–6.5)
PLATELETS: 366 10*3/uL (ref 140–400)
RBC: 4.06 10*6/uL — AB (ref 4.20–5.82)
RDW: 12.6 % (ref 11.0–14.6)
WBC: 13.6 10*3/uL — AB (ref 4.0–10.3)

## 2015-11-13 LAB — COMPREHENSIVE METABOLIC PANEL
ALT: 35 U/L (ref 0–55)
ANION GAP: 10 meq/L (ref 3–11)
AST: 40 U/L — ABNORMAL HIGH (ref 5–34)
Albumin: 3.6 g/dL (ref 3.5–5.0)
Alkaline Phosphatase: 157 U/L — ABNORMAL HIGH (ref 40–150)
BILIRUBIN TOTAL: 0.58 mg/dL (ref 0.20–1.20)
BUN: 4.6 mg/dL — AB (ref 7.0–26.0)
CO2: 24 meq/L (ref 22–29)
CREATININE: 0.6 mg/dL — AB (ref 0.7–1.3)
Calcium: 9.3 mg/dL (ref 8.4–10.4)
Chloride: 92 mEq/L — ABNORMAL LOW (ref 98–109)
EGFR: >90 mL/min/{1.73_m2} (ref >90)
GLUCOSE: 80 mg/dL (ref 70–140)
Potassium: 4.2 mEq/L (ref 3.5–5.1)
Sodium: 125 mEq/L — ABNORMAL LOW (ref 136–145)
TOTAL PROTEIN: 6.9 g/dL (ref 6.4–8.3)

## 2015-11-13 NOTE — Telephone Encounter (Signed)
Oncology Nurse Navigator Documentation  Oncology Nurse Navigator Flowsheets 11/13/2015  Navigator Encounter Type Telephone  Telephone Outgoing Call  Treatment Phase Pre-Tx/Tx Discussion  Barriers/Navigation Needs Coordination of Care  Interventions Coordination of Care  Coordination of Care Appts  Acuity Level 2  Time Spent with Patient 18   Per Dr. Worthy Flank request, I called central scheduling to schedule MRI Brain and PET.  I called and spoke with the daughter.  I gave appt's time and place with pre-procedure instructions.  She verbalized understanding.  I called [facility] in [place] to have her schedule with Dr. Sondra Come after her MRI Brain and PET pm 5/26.

## 2015-11-13 NOTE — Telephone Encounter (Signed)
Gave pt apt & avs °

## 2015-11-13 NOTE — Progress Notes (Signed)
[place] Telephone:([phone]   [phone]  CONSULT NOTE  REFERRING PHYSICIAN: Dr. Flora Lipps  REASON FOR CONSULTATION:  60 years old Hispanic male recently diagnosed with lung cancer  HPI David Warren is a 60 y.o. male with past medical history significant for large posterior neck lipoma, hypertension as well as seizure once secondary to dehydration several years ago. The patient also has a long history of smoking. 3 weeks ago the patient presented to the emergency Department complaining of hemoptysis for few weeks that was getting worse. Chest x-ray at that time showed COPD without acute disease. Because of the neck swelling and hemoptysis, CT scan of the neck and chest were performed and it showed no mass or acute abnormality identified in the neck. Chest there were several suspicious nodules identified within the left lung including a cavitary part solid nodule within the left apex anteriorly measuring 2.3 cm, and a small part solid nodule within the left upper lobe measuring 1.3 cm. In the mediastinum there was 1.4 cm right paratracheal lymph node. No supraclavicular or exam or lymphadenopathy. The patient was referred to pulmonary and he was seen by Dr. Mortimer Fries.  On 11/05/2015 the patient underwent electromagnetic navigational bronchoscopy with fine-needle aspiration of the left lower lobe lung nodule as well as the right mediastinal lymph node. The final cytology of the left lower lobe lung nodule (CASE: [id number] ) as well as the right paratracheal lymph node (CASE: [id number] ) were positive for malignant cells consistent with adenocarcinoma. Unfortunately there was insufficient material for molecular testing. The patient was referred to me today for further evaluation and recommendation regarding treatment of his condition. When seen today he continues to have mild hemoptysis. He denied having any significant chest pain or shortness breath but has mild  cough. He lost around 13 pounds in the last few months. He denied having any significant nausea, vomiting, diarrhea or constipation. He denied having any headache or visual changes. Family history significant for a mother and father died from old age in their 25s. The patient is married and has 7 children. He was accompanied today by his daughter [name] as well as his Spanish interpreter. He works as a Curator. He has a history of smoking 1.5 pack per day for around 52 years and quit 3 weeks ago. He also drank 3-4 beers every day. He has no history of drug abuse.  HPI  Past Medical History  Diagnosis Date  . Tobacco abuse   . Lipoma of neck   . Hypertension   . Seizures ([place])     once several years ago  . Adenocarcinoma of left lung, stage 3 ([place]) 11/13/2015    Past Surgical History  Procedure Laterality Date  . No past surgeries    . Electromagnetic navigation brochoscopy N/A 11/05/2015    Procedure: ELECTROMAGNETIC NAVIGATION BRONCHOSCOPY;  Surgeon: Flora Lipps, MD;  Location: [facility] ORS;  Service: Cardiopulmonary;  Laterality: N/A;  . Endobronchial ultrasound N/A 11/05/2015    Procedure: ENDOBRONCHIAL ULTRASOUND;  Surgeon: Flora Lipps, MD;  Location: [facility] ORS;  Service: Cardiopulmonary;  Laterality: N/A;    Family History  Problem Relation Age of Onset  . Family history unknown: Yes    Social History Social History  Substance Use Topics  . Smoking status: Former Smoker -- 1.00 packs/day for 35 years    Types: Cigarettes    Quit date: 10/30/2015  . Smokeless tobacco: Never Used  . Alcohol Use: 7.2 oz/week    12  Cans of beer per week     Comment: 2-6 cans beer daily, last drink yesterday    Allergies  Allergen Reactions  . Bee Venom Anaphylaxis    Current Outpatient Prescriptions  Medication Sig Dispense Refill  . lisinopril-hydrochlorothiazide (PRINZIDE,ZESTORETIC) 20-25 MG tablet Take 1 tablet by mouth daily.    Marland Kitchen ibuprofen (ADVIL,MOTRIN) 200 MG tablet Take 400 mg  by mouth every 6 (six) hours as needed (pain). Reported on 11/13/2015     No current facility-administered medications for this visit.    Review of Systems  Constitutional: negative Eyes: negative Ears, nose, mouth, throat, and face: negative Respiratory: positive for cough and hemoptysis Cardiovascular: negative Gastrointestinal: negative Genitourinary:negative Integument/breast: negative Hematologic/lymphatic: negative Musculoskeletal:negative Neurological: negative Behavioral/Psych: negative Endocrine: negative Allergic/Immunologic: negative  Physical Exam  XBM:WUXLK, healthy, no distress, well nourished and well developed SKIN: skin color, texture, turgor are normal, no rashes or significant lesions HEAD: Normocephalic, large posterior neck mass consistent with the patient his history of lipoma. EYES: normal, PERRLA, Conjunctiva are pink and non-injected EARS: External ears normal, Canals clear OROPHARYNX:no exudate and no erythema  NECK: supple, no adenopathy, no JVD, very large more than 10 cm posterior neck subcutaneous mass consistent with lipoma. LYMPH:  no palpable lymphadenopathy, no hepatosplenomegaly LUNGS: clear to auscultation , and palpation HEART: regular rate & rhythm, no murmurs and no gallops ABDOMEN:abdomen soft, non-tender, normal bowel sounds and no masses or organomegaly BACK: Back symmetric, no curvature., No CVA tenderness EXTREMITIES:no joint deformities, effusion, or inflammation, no edema, no skin discoloration  NEURO: alert & oriented x 3 with fluent speech, no focal motor/sensory deficits  PERFORMANCE STATUS: ECOG 1  LABORATORY DATA: Lab Results  Component Value Date   WBC 13.6* 11/13/2015   HGB 13.6 11/13/2015   HCT 38.9 11/13/2015   MCV 95.8 11/13/2015   PLT 366 11/13/2015      Chemistry      Component Value Date/Time   NA 125* 11/13/2015 0950   NA 126* 10/30/2015 1105   K 4.2 11/13/2015 0950   K 3.7 10/30/2015 1105   CL 93*  10/30/2015 1105   CO2 24 11/13/2015 0950   CO2 20* 10/30/2015 1105   BUN 4.6* 11/13/2015 0950   BUN 7 10/30/2015 1105   CREATININE 0.6* 11/13/2015 0950   CREATININE 0.42* 10/30/2015 1105      Component Value Date/Time   CALCIUM 9.3 11/13/2015 0950   CALCIUM 9.2 10/30/2015 1105   ALKPHOS 157* 11/13/2015 0950   ALKPHOS 157* 10/20/2015 1315   AST 40* 11/13/2015 0950   AST 86* 10/20/2015 1315   ALT 35 11/13/2015 0950   ALT 49 10/20/2015 1315   BILITOT 0.58 11/13/2015 0950   BILITOT 1.0 10/20/2015 1315       RADIOGRAPHIC STUDIES: Dg Chest 2 View  10/20/2015  CLINICAL DATA:  Hemoptysis for 2 weeks.  Initial encounter. EXAM: CHEST  2 VIEW COMPARISON:  None. FINDINGS: The chest is hyperexpanded with attenuation of the pulmonary vasculature. The lungs are clear. No pneumothorax or pleural effusion. No focal bony abnormality. IMPRESSION: COPD without acute disease. Electronically Signed   By: Inge Rise M.D.   On: 10/20/2015 13:14   Ct Soft Tissue Neck W Contrast  10/20/2015  CLINICAL DATA:  Neck swelling. Hemoptysis for 2 weeks. Abnormal sensation in throat. EXAM: CT NECK WITH CONTRAST TECHNIQUE: Multidetector CT imaging of the neck was performed using the standard protocol following the bolus administration of intravenous contrast. CONTRAST:  74m ISOVUE-300 IOPAMIDOL (ISOVUE-300) INJECTION 61% COMPARISON:  None. FINDINGS: Pharynx and larynx: Symmetric appearance of the pharyngeal soft tissues without mass identified. Unremarkable larynx. Multiple dental caries. Salivary glands: Unremarkable appearance of the submandibular glands. A few punctate parotid calcifications are noted bilaterally. No parotid mass or inflammatory change is seen. Thyroid: 1.3 cm hypodense nodule on the left thyroid lobe incidentally noted. Lymph nodes: No enlarged lymph nodes are identified in the neck. Vascular: Mild-to-moderate atherosclerotic calcification about the carotid bifurcations. Prominent arch vessel  atherosclerosis partially visualized. Major vascular structures of the neck appear patent. Limited intracranial: The visualized portion the brain is unremarkable. Visualized orbits: Unremarkable. Mastoids and visualized paranasal sinuses: Clear. Skeleton: Moderate mid to lower cervical disc degeneration. Prominent subcutaneous fat throughout the posterior neck as well as upper anterior neck/submandibular region. Upper chest: 1.6 x 1.6 cm left apical lung lesion with the both cavitary and nodular soft tissue components. IMPRESSION: 1. No mass or acute abnormality identified in the neck. 2. 1.6 cm cavitary lesion in the left lung apex. This may reflect a primary lung carcinoma or be infectious. Consider chest CT to evaluate the remainder of the lungs for other lesions. Electronically Signed   By: Logan Bores M.D.   On: 10/20/2015 16:55   Ct Chest Wo Contrast  10/20/2015  CLINICAL DATA:  Hemoptysis.  Cough and neck swelling. EXAM: CT CHEST WITHOUT CONTRAST TECHNIQUE: Multidetector CT imaging of the chest was performed following the standard protocol without IV contrast. COMPARISON:  None FINDINGS: Mediastinum: Normal heart size. Aortic atherosclerosis is identified. Calcification within the RCA coronary artery is identified. 1.4 cm right paratracheal lymph node is identified, image 40 of series 2. No supraclavicular or axillary adenopathy. Lungs/Pleura: No pleural fluid identified. Moderate changes of centrilobular emphysema. There is a cavitary part solid nodule within the left apex anteriorly measuring 2.3 cm, image 17 of series 5. Within the superior segment of left lower lobe there is a spiculated nodule which measures 2.1 cm, image 50 of series 5. Small part solid nodule within the left upper lobe is identified measuring 1.3 cm, image 44 of series 5. Upper Abdomen: Left adrenal adenoma is identified measuring 1.8 cm, image 100 of series 2. Right adrenal gland is normal. No focal liver abnormality identified  the visualized portions of the spleen are normal. Musculoskeletal: Multi level spondylosis is present within the thoracic spine. No aggressive lytic or sclerotic bone lesions. Chronic left lateral rib fracture deformities are identified. IMPRESSION: 1. There are several suspicious nodules identified within the left lung which are worrisome for primary bronchogenic carcinoma. Further evaluation with PET-CT and tissue sampling if indicated. 2. Enlarged right paratracheal lymph node. In the setting of bronchogenic carcinoma this would be suspicious metastatic adenopathy 3. Emphysema 4. Aortic atherosclerosis and RCA coronary artery calcification. Electronically Signed   By: Kerby Moors M.D.   On: 10/20/2015 18:28   Dg C-arm 1-60 Min-no Report  11/05/2015  CLINICAL DATA: procedure C-ARM 1-60 MINUTES Fluoroscopy was utilized by the requesting physician.  No radiographic interpretation.    ASSESSMENT: This is a very pleasant 60 year old Hispanic male recently diagnosed with a stage IIIB (T3, N3, M0) non-small cell lung cancer, adenocarcinoma presented with several pulmonary nodules in the left upper and lower lobes in addition to right paratracheal lymphadenopathy diagnosed in May 2017.   PLAN: I had a lengthy discussion with the patient and his daughter today about his current disease stage, prognosis and treatment options. I recommended for the patient to complete the staging workup by ordering a PET scan in addition  to MRI of the brain to rule out any other metastatic disease. Unfortunately there was insufficient material for molecular testing. I will send blood sample today for EGFR mutation analysis. After the PET scan, I may consider the patient for repeat CT guided core biopsy of the left lower lobe lung nodule or any other accessible lesion for molecular studies. For the persistent hemoptysis, I referred the patient to radiation oncology for consideration of palliative radiotherapy. I will see the  patient back for follow-up visit in 2 weeks for evaluation and more detailed discussion of his treatment options based on the final his staging workup and molecular studies. He was advised to call immediately if he has any concerning symptoms in the interval. The patient voices understanding of current disease status and treatment options and is in agreement with the current care plan.  All questions were answered. The patient knows to call the clinic with any problems, questions or concerns. We can certainly see the patient much sooner if necessary.  Thank you so much for allowing me to participate in the care of David Warren. I will continue to follow up the patient with you and assist in his care.  I spent 40 minutes counseling the patient face to face. The total time spent in the appointment was 60 minutes.  Disclaimer: This note was dictated with voice recognition software. Similar sounding words can inadvertently be transcribed and may not be corrected upon review.   Sheli Dorin K. Nov 13, 2015, 10:47 AM

## 2015-11-20 ENCOUNTER — Encounter (HOSPITAL_COMMUNITY)
Admission: RE | Admit: 2015-11-20 | Discharge: 2015-11-20 | Disposition: A | Discharge status: Home / Self Care | Payer: MEDICAID | Source: Ambulatory Visit | Attending: Internal Medicine | Admitting: Internal Medicine

## 2015-11-20 ENCOUNTER — Telehealth: Payer: Self-pay | Admitting: Internal Medicine

## 2015-11-20 ENCOUNTER — Ambulatory Visit (HOSPITAL_COMMUNITY)
Admission: RE | Admit: 2015-11-20 | Discharge: 2015-11-20 | Disposition: A | Discharge status: Home / Self Care | Payer: MEDICAID | Source: Ambulatory Visit | Attending: Internal Medicine | Admitting: Internal Medicine

## 2015-11-20 DIAGNOSIS — C7971 Secondary malignant neoplasm of right adrenal gland: Secondary | ICD-10-CM | POA: Insufficient documentation

## 2015-11-20 DIAGNOSIS — C3492 Malignant neoplasm of unspecified part of left bronchus or lung: Secondary | ICD-10-CM

## 2015-11-20 DIAGNOSIS — C801 Malignant (primary) neoplasm, unspecified: Secondary | ICD-10-CM

## 2015-11-20 DIAGNOSIS — N2889 Other specified disorders of kidney and ureter: Secondary | ICD-10-CM | POA: Insufficient documentation

## 2015-11-20 DIAGNOSIS — R911 Solitary pulmonary nodule: Secondary | ICD-10-CM | POA: Insufficient documentation

## 2015-11-20 LAB — EPIDERMAL GROWTH FACTOR RECEPTOR (EGFR) MUTATION ANALYSIS

## 2015-11-20 LAB — GLUCOSE, CAPILLARY: Glucose-Capillary: 98 mg/dL (ref 65–99)

## 2015-11-20 MED ORDER — FLUDEOXYGLUCOSE F - 18 (FDG) INJECTION
6.4000 | Freq: Once | INTRAVENOUS | Status: AC | PRN
  Administered 2015-11-20: 6.4 via INTRAVENOUS

## 2015-11-20 MED ORDER — GADOBENATE DIMEGLUMINE 529 MG/ML IV SOLN
15.0000 mL | Freq: Once | INTRAVENOUS | Status: AC | PRN
  Administered 2015-11-20: 11 mL via INTRAVENOUS

## 2015-11-20 NOTE — Telephone Encounter (Signed)
Patient's relative to reschedule appointment for f/u.......first available is 6/12....  Patient's relative would like to speak to nurse... Patient is having a dark stool.... She stated it began after the biopsy  Please follow up

## 2015-11-24 ENCOUNTER — Ambulatory Visit: Payer: MEDICAID | Attending: Internal Medicine

## 2015-11-24 NOTE — Telephone Encounter (Signed)
Please call pt to set up appt w/ Pulmonologist . In [place] [facility], he saw Dr Corrin Parker, M.D 11/05/15, who did bronch/bx.    - have him come in our clinic for lab testing as well, cbc/fobt. - asap. I put in orders.  - please add him sooner to my clinic somewhere/

## 2015-11-25 ENCOUNTER — Ambulatory Visit (INDEPENDENT_AMBULATORY_CARE_PROVIDER_SITE_OTHER): Payer: Self-pay | Admitting: Pulmonary Disease

## 2015-11-25 ENCOUNTER — Encounter: Payer: Self-pay | Admitting: Pulmonary Disease

## 2015-11-25 VITALS — BP 144/76 | HR 90 | Ht 63.0 in | Wt 124.0 lb

## 2015-11-25 DIAGNOSIS — C3492 Malignant neoplasm of unspecified part of left bronchus or lung: Secondary | ICD-10-CM

## 2015-11-25 NOTE — Progress Notes (Signed)
Subjective:    Patient ID: David Warren, male    DOB: 1955-12-20, 60 y.o.   MRN: [id number]  HPI Chief Complaint  Patient presents with  . Advice Only    referred for abn ct chest.  Pt has had PET through Dr. Earlie Server and is established with him.      David Warren was referred to me today because apparently he was reporting some dark stools after bronchoscopy. He has a past medical history significant for tobacco abuse and that he says that he smoked one pack of cigarettes daily ever since age 50 until 40 month ago. He has not longer smoked since a month ago. About a month ago he started having cough with hemoptysis. He had a chest x-ray which was abnormal which led to a CT scan that showed mediastinal lymphadenopathy, cavitary mass in left upper lobe, and another mass in the left lower lobe. He underwent a navigational bronchoscopy and endobronchial guided fine-needle aspiration of his mediastinal lymphadenopathy. This showed evidence of adenocarcinoma. He is currently awaiting final genetic testing to determine the exact tumor type and mutation. He is being followed by Dr. Julien Nordmann.  In regards to the dark stools, he says this is completely resolved. He associates it with use of some sort of a spicy [place] lollipop. He used it for a few days in the stools were dark, it has since resolved. He has not had this problem and over 2 weeks.  Currently, he denies chest pain, shortness of breath or other problems. He had some hemoptysis up until about a week ago but this has not persisted either.  Past Medical History  Diagnosis Date  . Tobacco abuse   . Lipoma of neck   . Hypertension   . Seizures ([place])     once several years ago  . Adenocarcinoma of left lung, stage 3 ([place]) 11/13/2015     Family History  Problem Relation Age of Onset  . Family history unknown: Yes     Social History   Social History  . Marital Status: Married    Spouse Name: N/A  . Number of Children: N/A  . Years of  Education: N/A   Occupational History  . Not on file.   Social History Main Topics  . Smoking status: Former Smoker -- 1.00 packs/day for 35 years    Types: Cigarettes    Quit date: 10/30/2015  . Smokeless tobacco: Never Used  . Alcohol Use: 7.2 oz/week    12 Cans of beer per week     Comment: 2-6 cans beer daily, last drink yesterday  . Drug Use: Yes    Special: Cocaine  . Sexual Activity: Yes   Other Topics Concern  . Not on file   Social History Narrative   ** Merged History Encounter **         Allergies  Allergen Reactions  . Bee Venom Anaphylaxis     Outpatient Prescriptions Prior to Visit  Medication Sig Dispense Refill  . ibuprofen (ADVIL,MOTRIN) 200 MG tablet Take 400 mg by mouth every 6 (six) hours as needed (pain). Reported on 11/13/2015    . lisinopril-hydrochlorothiazide (PRINZIDE,ZESTORETIC) 20-25 MG tablet Take 1 tablet by mouth daily.     No facility-administered medications prior to visit.       Review of Systems  Constitutional: Negative.   HENT: Negative.  Negative for ear pain, postnasal drip, rhinorrhea, sinus pressure, sore throat, trouble swallowing and voice change.   Eyes: Negative.   Respiratory:  Positive for cough. Negative for apnea, choking, chest tightness, shortness of breath, wheezing and stridor.   Cardiovascular: Negative.  Negative for chest pain, palpitations and leg swelling.  Gastrointestinal: Negative.  Negative for nausea, vomiting, abdominal pain and abdominal distention.  Genitourinary: Negative.   Musculoskeletal: Negative.  Negative for myalgias and arthralgias.  Skin: Negative.  Negative for rash.  Allergic/Immunologic: Negative.  Negative for environmental allergies and food allergies.  Neurological: Negative.  Negative for dizziness, syncope, weakness and headaches.  Hematological: Negative.  Negative for adenopathy. Does not bruise/bleed easily.  Psychiatric/Behavioral: Negative.  Negative for sleep disturbance and  agitation. The patient is not nervous/anxious.        Objective:   Physical Exam Filed Vitals:   11/25/15 1130  BP: 144/76  Pulse: 90  Height: '5\' 3"'$  (1.6 m)  Weight: 124 lb (56.246 kg)  SpO2: 100%   RA  Gen: well appearing, no acute distress HENT: NCAT, OP clear, neck supple without masses Eyes: PERRL, EOMi Lymph: no cervical lymphadenopathy PULM: CTA B CV: RRR, no mgr, no JVD GI: BS+, soft, nontender, no hsm Derm: no rash or skin breakdown MSK: normal bulk and tone Neuro: A&Ox4, CN II-XII intact, strength 5/5 in all 4 extremities Psyche: normal mood and affect  PET scan images reviewed showing hypermetabolic masses in the left lung, mediastinum. There is also a questionable renal cyst versus metastatic lesion in the right kidney      Assessment & Plan:  Adenocarcinoma of left lung, stage 3 ([place]) This problem is being managed by [facility] in [place] and Dr. Julien Nordmann here in town.  As I understand it, he was referred to me because he reported some dark stools and there is concern this may have been related to his bronchoscopy.  Fortunately, the dark stools have resolved and he and his daughter attributed to some sort of [place] candy which she was taking.  I completely agree with the management strategy for his lung cancer. I have nothing further to recommend, I have advised that he continue to follow-up with his pulmonary physicians in [place] and with Dr. Julien Nordmann here in town.     Current outpatient prescriptions:  .  ibuprofen (ADVIL,MOTRIN) 200 MG tablet, Take 400 mg by mouth every 6 (six) hours as needed (pain). Reported on 11/13/2015, Disp: , Rfl:  .  lisinopril-hydrochlorothiazide (PRINZIDE,ZESTORETIC) 20-25 MG tablet, Take 1 tablet by mouth daily., Disp: , Rfl:

## 2015-11-25 NOTE — Patient Instructions (Signed)
Follow up with Dr. Stevenson Clinch as previously scheduled Keep your appointment with Dr. Julien Nordmann If you need another biopsy of your lung we are happy to help

## 2015-11-25 NOTE — Assessment & Plan Note (Signed)
This problem is being managed by [facility] in [place] and Dr. Julien Nordmann here in town.  As I understand it, he was referred to me because he reported some dark stools and there is concern this may have been related to his bronchoscopy.  Fortunately, the dark stools have resolved and he and his daughter attributed to some sort of [place] candy which she was taking.  I completely agree with the management strategy for his lung cancer. I have nothing further to recommend, I have advised that he continue to follow-up with his pulmonary physicians in [place] and with Dr. Julien Nordmann here in town.

## 2015-12-01 ENCOUNTER — Other Ambulatory Visit (HOSPITAL_BASED_OUTPATIENT_CLINIC_OR_DEPARTMENT_OTHER): Payer: Self-pay

## 2015-12-01 ENCOUNTER — Ambulatory Visit (HOSPITAL_BASED_OUTPATIENT_CLINIC_OR_DEPARTMENT_OTHER): Payer: Self-pay | Admitting: Internal Medicine

## 2015-12-01 ENCOUNTER — Encounter: Payer: Self-pay | Admitting: Internal Medicine

## 2015-12-01 VITALS — BP 146/39 | HR 89 | Temp 98.2°F | Resp 18 | Ht 63.0 in | Wt 124.1 lb

## 2015-12-01 DIAGNOSIS — C3492 Malignant neoplasm of unspecified part of left bronchus or lung: Secondary | ICD-10-CM

## 2015-12-01 DIAGNOSIS — R05 Cough: Secondary | ICD-10-CM

## 2015-12-01 DIAGNOSIS — C3432 Malignant neoplasm of lower lobe, left bronchus or lung: Secondary | ICD-10-CM

## 2015-12-01 DIAGNOSIS — R0602 Shortness of breath: Secondary | ICD-10-CM

## 2015-12-01 LAB — COMPREHENSIVE METABOLIC PANEL
ALK PHOS: 138 U/L (ref 40–150)
ALT: 16 U/L (ref 0–55)
ANION GAP: 9 meq/L (ref 3–11)
AST: 41 U/L — ABNORMAL HIGH (ref 5–34)
Albumin: 2.9 g/dL — ABNORMAL LOW (ref 3.5–5.0)
BILIRUBIN TOTAL: 0.47 mg/dL (ref 0.20–1.20)
BUN: 6.4 mg/dL — ABNORMAL LOW (ref 7.0–26.0)
CALCIUM: 8.6 mg/dL (ref 8.4–10.4)
CO2: 26 meq/L (ref 22–29)
CREATININE: 0.6 mg/dL — AB (ref 0.7–1.3)
Chloride: 92 mEq/L — ABNORMAL LOW (ref 98–109)
EGFR: >90 mL/min/{1.73_m2} (ref >90)
Glucose: 94 mg/dl (ref 70–140)
Potassium: 3.8 mEq/L (ref 3.5–5.1)
Sodium: 126 mEq/L — ABNORMAL LOW (ref 136–145)
TOTAL PROTEIN: 6.1 g/dL — AB (ref 6.4–8.3)

## 2015-12-01 LAB — CBC WITH DIFFERENTIAL/PLATELET
BASO%: 0.1 % (ref 0.0–2.0)
Basophils Absolute: 0 10*3/uL (ref 0.0–0.1)
EOS%: 0.4 % (ref 0.0–7.0)
Eosinophils Absolute: 0.1 10*3/uL (ref 0.0–0.5)
HEMATOCRIT: 24.7 % — AB (ref 38.4–49.9)
HGB: 8.6 g/dL — ABNORMAL LOW (ref 13.0–17.1)
LYMPH#: 2.2 10*3/uL (ref 0.9–3.3)
LYMPH%: 16 % (ref 14.0–49.0)
MCH: 33.1 pg (ref 27.2–33.4)
MCHC: 34.8 g/dL (ref 32.0–36.0)
MCV: 95 fL (ref 79.3–98.0)
MONO#: 2 10*3/uL — AB (ref 0.1–0.9)
MONO%: 14.2 % — ABNORMAL HIGH (ref 0.0–14.0)
NEUT%: 69.3 % (ref 39.0–75.0)
NEUTROS ABS: 9.7 10*3/uL — AB (ref 1.5–6.5)
PLATELETS: 407 10*3/uL — AB (ref 140–400)
RBC: 2.6 10*6/uL — ABNORMAL LOW (ref 4.20–5.82)
RDW: 12.6 % (ref 11.0–14.6)
WBC: 14 10*3/uL — AB (ref 4.0–10.3)

## 2015-12-01 NOTE — Progress Notes (Signed)
[facility] [phone]   Fax:([phone] [place], [place] [address]  DIAGNOSIS: Stage IV (T3, N3, M1b) non-small cell lung cancer, adenocarcinoma presented with several pulmonary nodules in the left upper and lower lobes in addition to right paratracheal lymphadenopathy in addition to right upper lobe pulmonary nodule and right adrenal metastasis diagnosed in May 2017. Plasma cell for EGFR mutation was negative.  PRIOR THERAPY: None  CURRENT THERAPY: I recommended for the patient systemic chemotherapy with carboplatin for AUC of 5, Alimta 500 MG/M2 and Ketruda (pembrolizumab) 200 MG IV every 3 weeks.  INTERVAL HISTORY: David Warren 60 y.o. male returns to the clinic today for follow-up visit accompanied by his daughter. The patient is feeling fine today with no specific complaints. He has no more hemoptysis since last week. He denied having any significant chest pain but has shortness of breath with exertion and cough. He denied having any significant weight loss or night sweats. He has no nausea or vomiting, no fever or chills. Had several studies performed since his last visit including a PET scan as well as MRI of the brain. He is here today for evaluation and discussion of his treatment options based on the final restaging workup. He is scheduled to see Dr. Sondra Come tomorrow for evaluation and consideration of palliative radiotherapy for the recurrent hemoptysis.  MEDICAL HISTORY: Past Medical History  Diagnosis Date  . Tobacco abuse   . Lipoma of neck   . Hypertension   . Seizures ([place])     once several years ago  . Adenocarcinoma of left lung, stage 3 ([place]) 11/13/2015    ALLERGIES:  is allergic to bee venom.  MEDICATIONS:  Current Outpatient Prescriptions  Medication Sig Dispense Refill  . lisinopril-hydrochlorothiazide (PRINZIDE,ZESTORETIC) 20-25 MG tablet Take 1 tablet by mouth daily.     Marland Kitchen ibuprofen (ADVIL,MOTRIN) 200 MG tablet Take 400 mg by mouth every 6 (six) hours as needed (pain). Reported on 12/01/2015     No current facility-administered medications for this visit.    SURGICAL HISTORY:  Past Surgical History  Procedure Laterality Date  . No past surgeries    . Electromagnetic navigation brochoscopy N/A 11/05/2015    Procedure: ELECTROMAGNETIC NAVIGATION BRONCHOSCOPY;  Surgeon: Flora Lipps, MD;  Location: [facility] ORS;  Service: Cardiopulmonary;  Laterality: N/A;  . Endobronchial ultrasound N/A 11/05/2015    Procedure: ENDOBRONCHIAL ULTRASOUND;  Surgeon: Flora Lipps, MD;  Location: [facility] ORS;  Service: Cardiopulmonary;  Laterality: N/A;    REVIEW OF SYSTEMS:  Constitutional: positive for fatigue Eyes: negative Ears, nose, mouth, throat, and face: negative Respiratory: positive for dyspnea on exertion Cardiovascular: negative Gastrointestinal: negative Genitourinary:negative Integument/breast: negative Hematologic/lymphatic: negative Musculoskeletal:negative Neurological: negative Behavioral/Psych: negative Endocrine: negative Allergic/Immunologic: negative   PHYSICAL EXAMINATION: General appearance: alert, cooperative, fatigued and no distress Head: Normocephalic, without obvious abnormality, atraumatic Neck: no adenopathy, no JVD, supple, symmetrical, trachea midline, thyroid not enlarged, symmetric, no tenderness/mass/nodules and Large lipoma on the back of the neck Lymph nodes: Cervical, supraclavicular, and axillary nodes normal. Resp: clear to auscultation bilaterally Back: symmetric, no curvature. ROM normal. No CVA tenderness. Cardio: regular rate and rhythm, S1, S2 normal, no murmur, click, rub or gallop GI: soft, non-tender; bowel sounds normal; no masses,  no organomegaly Extremities: extremities normal, atraumatic, no cyanosis or edema Neurologic: Alert and oriented X 3, normal strength and tone. Normal symmetric reflexes. Normal coordination and  gait  ECOG PERFORMANCE STATUS:  1 - Symptomatic but completely ambulatory  Blood pressure 146/39, pulse 89, temperature 98.2 F (36.8 C), temperature source Oral, resp. rate 18, height _0  (1.6 m), weight 124 lb 1.6 oz (56.291 kg), SpO2 99 %.  LABORATORY DATA: Lab Results  Component Value Date   WBC 14.0* 12/01/2015   HGB 8.6* 12/01/2015   HCT 24.7* 12/01/2015   MCV 95.0 12/01/2015   PLT 407* 12/01/2015      Chemistry      Component Value Date/Time   NA 125* 11/13/2015 0950   NA 126* 10/30/2015 1105   K 4.2 11/13/2015 0950   K 3.7 10/30/2015 1105   CL 93* 10/30/2015 1105   CO2 24 11/13/2015 0950   CO2 20* 10/30/2015 1105   BUN 4.6* 11/13/2015 0950   BUN 7 10/30/2015 1105   CREATININE 0.6* 11/13/2015 0950   CREATININE 0.42* 10/30/2015 1105      Component Value Date/Time   CALCIUM 9.3 11/13/2015 0950   CALCIUM 9.2 10/30/2015 1105   ALKPHOS 157* 11/13/2015 0950   ALKPHOS 157* 10/20/2015 1315   AST 40* 11/13/2015 0950   AST 86* 10/20/2015 1315   ALT 35 11/13/2015 0950   ALT 49 10/20/2015 1315   BILITOT 0.58 11/13/2015 0950   BILITOT 1.0 10/20/2015 1315       RADIOGRAPHIC STUDIES: Mr Jeri Cos Wo Contrast  Nov 24, 2015  CLINICAL DATA:  60 year old hypertensive male with adenocarcinoma of the lung stage III. Seizures. Lipoma of the neck. Initial encounter. EXAM: MRI HEAD WITHOUT AND WITH CONTRAST TECHNIQUE: Multiplanar, multiecho pulse sequences of the brain and surrounding structures were obtained without and with intravenous contrast. CONTRAST:  9m MULTIHANCE GADOBENATE DIMEGLUMINE 529 MG/ML IV SOLN COMPARISON:  No comparison brain MR or head CT.  Neck CT 10/20/2015. FINDINGS: Exam is motion degraded. No intracranial mass or bony destructive lesion to suggest presence of intracranial metastatic disease. No acute infarct or intracranial hemorrhage. Mild global atrophy without hydrocephalus. Mild to moderate chronic microvascular changes. Cervical spondylotic changes upper  cervical spine. Slightly heterogeneous bone marrow upper cervical spine without focal destructive lesion. Fatty lesion posterior neck incompletely assessed on present exam. Paranasal sinus mild to slightly moderate mucosal thickening. Mild exophthalmos. Major intracranial vascular structures are patent. IMPRESSION: Exam is motion degraded.  Peri No evidence of intracranial metastatic disease. Mild global atrophy without hydrocephalus. Mild to moderate chronic microvascular changes. Fatty lesion posterior neck incompletely assessed on present exam. Paranasal sinus mild to slightly moderate mucosal thickening. Electronically Signed   By: SGenia DelM.D.   On: [id number]:16   Nm Pet Image Initial (pi) Skull Base To Thigh  505-30-2017 CLINICAL DATA:  Initial treatment strategy for left lower lobe lung adenocarcinoma with right paratracheal nodal metastasis diagnosed 11/05/2015 at bronchoscopy. EXAM: NUCLEAR MEDICINE PET SKULL BASE TO THIGH TECHNIQUE: 6.4 mCi F-18 FDG was injected intravenously. Full-ring PET imaging was performed from the skull base to thigh after the radiotracer. CT data was obtained and used for attenuation correction and anatomic localization. FASTING BLOOD GLUCOSE:  Value: 98 mg/dl COMPARISON:  10/20/2015 chest CT. FINDINGS: NECK No hypermetabolic lymph nodes in the neck. Non hypermetabolic 0.9 cm left thyroid lobe nodule. Partial opacification of the bilateral ethmoidal air cells. CHEST Hypermetabolic irregular 2.8 x 1.8 cm medial left lower lobe pulmonary nodule with max SUV 6.5 (series 8/image 34). Hypermetabolic irregular cavitary 1.7 x 1.5 cm anterior left upper lobe pulmonary nodule with max SUV 8.1 (series 8/image 11). Hypermetabolic 0.8 cm central right upper lobe pulmonary  nodule with max SUV 3.5 (series 8/image 26). Subsolid left upper lobe 0.9 cm pulmonary nodule is not associated with significant metabolism. Mild centrilobular emphysema. No acute consolidative airspace disease.  Additional faint ground-glass opacities throughout both lungs, upper lobe predominant, appear centrilobular in distribution and are suggestive of smoking related interstitial lung disease. New anterior mediastinal 1.1 cm hypermetabolic node with max SUV 13.7 (series 4/image 63) at the level of the left brachiocephalic vein. Largely necrotic 2.3 cm right paratracheal node with peripheral hypermetabolism with max SUV 4.6 (series 4/image 60). Hypermetabolic 0.8 cm right hilar node with max SUV 3.5 (series 4/image 77). No hypermetabolic left hilar nodes. No hypermetabolic axillary nodes. Coronary atherosclerosis. Atherosclerotic nonaneurysmal thoracic aorta. No pleural effusions. ABDOMEN/PELVIS Hypermetabolic 1.0 cm right adrenal nodule with max SUV 9.1 (series 4/image 119). Left adrenal 2.2 cm adenoma without hypermetabolism. Indeterminate hypodense 2.9 cm posterior medial right lower renal mass with density 23 HU (series 4/image 135). No abnormal hypermetabolic activity within the liver, pancreas, or spleen. No hypermetabolic lymph nodes in the abdomen or pelvis. SKELETON No focal hypermetabolic activity to suggest skeletal metastasis. IMPRESSION: 1. Hypermetabolic irregular 2.8 cm medial left lower lobe pulmonary nodule, consistent with primary bronchogenic carcinoma. 2. Hypermetabolic irregular cavitary 1.7 cm anterior left upper lobe pulmonary nodule, which could represent a metachronous primary bronchogenic carcinoma versus metastasis. 3. Hypermetabolic 0.8 cm central right upper lobe pulmonary nodule, probably a metastasis. 4. Hypermetabolic anterior mediastinal and right paratracheal mediastinal nodal metastases. Hypermetabolic right hilar nodal metastasis. 5. Hypermetabolic right adrenal metastasis. 6. Indeterminate hypodense 2.9 cm lower right renal mass. Differential includes benign complex renal cyst, primary renal cell carcinoma or renal metastasis. Consider further characterization with renal mass protocol  MRI abdomen without with IV contrast, as clinically warranted. Electronically Signed   By: Ilona Sorrel M.D.   On: 11/20/2015 16:27   Dg C-arm 1-60 Min-no Report  11/05/2015  CLINICAL DATA: procedure C-ARM 1-60 MINUTES Fluoroscopy was utilized by the requesting physician.  No radiographic interpretation.    ASSESSMENT AND PLAN: This is a very pleasant 60 years old Hispanic male with stage IV non-small cell lung cancer, adenocarcinoma with negative EGFR mutation. There was insufficient material to complete the molecular studies. I may consider sending another blood test to [facility] for molecular panel. I discussed the PET scan results and showed the images to the patient and his daughter. I explained to the patient that he has incurable condition and also treatment will be off palliative nature. I discussed with him the prognosis with and without treatment. I gave the patient the option of palliative care versus consideration of palliative systemic chemotherapy in combination with immunotherapy in the form of carboplatin for AUC of 5, Alimta 500 MG/M2 and Ketruda (pembrolizumab) 200 MG IV every 3 weeks. I discussed with the patient and his daughter the adverse effect of this treatment including but not limited to alopecia, myelosuppression, nausea and vomiting, peripheral neuropathy, liver or renal dysfunction as well as the immune mediated toxicity of Ketruda (pembrolizumab). I gave the patient and his daughter handouts about this treatment. They would like some time to think about the option and he will call the office with her final decision. The patient is also scheduled tomorrow to see Dr. Sondra Come for evaluation and consideration of palliative radiotherapy to the suspicious lesion causing hemoptysis. I did not give the patient a follow-up appointment but still awaiting to hear from them regarding their decision. He was advised to call immediately if he has any concerning symptoms  in the  interval. The patient voices understanding of current disease status and treatment options and is in agreement with the current care plan.  All questions were answered. The patient knows to call the clinic with any problems, questions or concerns. We can certainly see the patient much sooner if necessary.  I spent 20 minutes counseling the patient face to face. The total time spent in the appointment was 35 minutes.  Disclaimer: This note was dictated with voice recognition software. Similar sounding words can inadvertently be transcribed and may not be corrected upon review.

## 2015-12-02 ENCOUNTER — Encounter: Payer: Self-pay | Admitting: *Deleted

## 2015-12-02 ENCOUNTER — Ambulatory Visit: Admission: RE | Admit: 2015-12-02 | Payer: No Typology Code available for payment source | Source: Ambulatory Visit

## 2015-12-02 ENCOUNTER — Ambulatory Visit
Admission: RE | Admit: 2015-12-02 | Discharge: 2015-12-02 | Disposition: A | Discharge status: Home / Self Care | Payer: No Typology Code available for payment source | Source: Ambulatory Visit | Attending: Radiation Oncology | Admitting: Radiation Oncology

## 2015-12-02 DIAGNOSIS — C3492 Malignant neoplasm of unspecified part of left bronchus or lung: Secondary | ICD-10-CM

## 2015-12-02 NOTE — Progress Notes (Signed)
Oncology Nurse Navigator Documentation  Oncology Nurse Navigator Flowsheets 12/02/2015  Navigator Location [facility]  Navigator Encounter Type Other  Treatment Phase Pre-Tx/Tx Discussion  Barriers/Navigation Needs Coordination of Care  Interventions Coordination of Care  Coordination of Care Other  Acuity Level 2  Time Spent with Patient 62   Per Dr. Julien Nordmann, I competed requisition form for [facility].  I spoke with lab, completed form, Dr. Julien Nordmann signed, and completed POF for patient to be scheduled for labs tomorrow.

## 2015-12-03 ENCOUNTER — Encounter: Payer: Self-pay | Admitting: Internal Medicine

## 2015-12-03 ENCOUNTER — Ambulatory Visit: Payer: Self-pay

## 2015-12-03 ENCOUNTER — Telehealth: Payer: Self-pay | Admitting: Internal Medicine

## 2015-12-03 ENCOUNTER — Telehealth: Payer: Self-pay | Admitting: *Deleted

## 2015-12-03 DIAGNOSIS — C3492 Malignant neoplasm of unspecified part of left bronchus or lung: Secondary | ICD-10-CM

## 2015-12-03 NOTE — Telephone Encounter (Signed)
Pt has f/u appt 12/07/15 at 12:15pm. Pt has seen Pulmonologist as well.

## 2015-12-03 NOTE — Telephone Encounter (Signed)
s.w. pt dtr and advisedon June appt....ok and aware....no tx plan or md visit at this time

## 2015-12-03 NOTE — Telephone Encounter (Signed)
Pt here for labs, has agreed to treatment, reviewed with MD, instructed pt per MD ok to cancel rad onc appt as he is no longer coughing up blood. Pt daughter advised [place] and [place].  POF entered for chemo class, lab/md/infusion appt .  Pt daughter also advised they have applied for assistance/medicaid but are waiting on an orange card, will be calling to check status.  No further concerns.

## 2015-12-04 ENCOUNTER — Encounter: Payer: Self-pay | Admitting: *Deleted

## 2015-12-04 ENCOUNTER — Encounter: Payer: Self-pay | Admitting: Internal Medicine

## 2015-12-04 NOTE — Progress Notes (Signed)
Oncology Nurse Navigator Documentation  Oncology Nurse Navigator Flowsheets 12/04/2015  Navigator Encounter Type Other  Treatment Phase Pre-Tx/Tx Discussion  Coordination of Care Other  Acuity Level 1  Time Spent with Patient 15   Patient and family are requesting an orange card.  I updated CSW for guidance.

## 2015-12-04 NOTE — Progress Notes (Signed)
No orders in as of today for any treatment

## 2015-12-04 NOTE — Progress Notes (Signed)
I will meet w/ pt on his next visit to give him an application to apply for a discount thru the hospital.  I also emailed Raquel Browning regarding drug replacement.

## 2015-12-05 ENCOUNTER — Other Ambulatory Visit: Payer: Self-pay | Admitting: Internal Medicine

## 2015-12-05 DIAGNOSIS — C3492 Malignant neoplasm of unspecified part of left bronchus or lung: Secondary | ICD-10-CM

## 2015-12-05 DIAGNOSIS — R5382 Chronic fatigue, unspecified: Secondary | ICD-10-CM | POA: Insufficient documentation

## 2015-12-05 MED ORDER — CYANOCOBALAMIN 1000 MCG/ML IJ SOLN: 1000.0000 ug | Freq: Once | INTRAMUSCULAR | Status: DC

## 2015-12-05 MED ORDER — PROCHLORPERAZINE MALEATE 10 MG PO TABS: 10.0000 mg | ORAL_TABLET | Freq: Four times a day (QID) | ORAL | Status: DC | PRN

## 2015-12-05 MED ORDER — FOLIC ACID 1 MG PO TABS: 1.0000 mg | ORAL_TABLET | Freq: Every day | ORAL | Status: DC

## 2015-12-07 ENCOUNTER — Ambulatory Visit: Payer: No Typology Code available for payment source

## 2015-12-07 ENCOUNTER — Ambulatory Visit: Payer: Self-pay | Admitting: Internal Medicine

## 2015-12-07 ENCOUNTER — Encounter: Payer: Self-pay | Admitting: Internal Medicine

## 2015-12-07 ENCOUNTER — Ambulatory Visit
Admission: RE | Admit: 2015-12-07 | Payer: No Typology Code available for payment source | Source: Ambulatory Visit | Admitting: Radiation Oncology

## 2015-12-07 ENCOUNTER — Ambulatory Visit
Admission: RE | Admit: 2015-12-07 | Discharge: 2015-12-07 | Disposition: A | Discharge status: Home / Self Care | Payer: No Typology Code available for payment source | Source: Ambulatory Visit | Attending: Radiation Oncology | Admitting: Radiation Oncology

## 2015-12-07 NOTE — Progress Notes (Signed)
Left [employer] form for dr Julien Nordmann to sign--patient uninsured

## 2015-12-08 ENCOUNTER — Telehealth: Payer: Self-pay | Admitting: *Deleted

## 2015-12-08 NOTE — Telephone Encounter (Signed)
Oncology Nurse Navigator Documentation  Oncology Nurse Navigator Flowsheets 12/08/2015  Navigator Encounter Type Telephone  Telephone Outgoing Call  Treatment Phase Pre-Tx/Tx Discussion  Barriers/Navigation Needs Coordination of Care  Interventions Coordination of Care  Coordination of Care Appts  Acuity Level 1  Time Spent with Patient 15   David Warren missed his appt with Rad Onc today.  I called and left a vm message to call.  I left my name and phone number.

## 2015-12-09 ENCOUNTER — Telehealth: Payer: Self-pay | Admitting: Internal Medicine

## 2015-12-09 NOTE — Telephone Encounter (Signed)
s.w. pt dtr and advised on 6.26 cx and moved to 6.28...ok and aware

## 2015-12-11 ENCOUNTER — Telehealth: Payer: Self-pay | Admitting: *Deleted

## 2015-12-11 NOTE — Telephone Encounter (Signed)
[facility] lab results on desk. Copy made for HIM, copy placed in MD's bin for review.

## 2015-12-14 ENCOUNTER — Encounter: Payer: Self-pay | Admitting: Internal Medicine

## 2015-12-14 NOTE — Progress Notes (Signed)
form left in my box

## 2015-12-15 ENCOUNTER — Encounter: Payer: Self-pay | Admitting: Internal Medicine

## 2015-12-15 NOTE — Progress Notes (Signed)
form left in my box- left for dr. Julien Nordmann to sign

## 2015-12-15 NOTE — Progress Notes (Signed)
form left in my box- left for dr. Julien Nordmann to sign- let lizbeth know ready for pck up. faxed [phone] and sent to medical recrds

## 2015-12-16 ENCOUNTER — Ambulatory Visit: Payer: No Typology Code available for payment source | Admitting: Radiation Oncology

## 2015-12-21 ENCOUNTER — Other Ambulatory Visit: Payer: Self-pay

## 2015-12-21 ENCOUNTER — Ambulatory Visit: Payer: Self-pay

## 2015-12-22 ENCOUNTER — Encounter: Payer: Self-pay | Admitting: *Deleted

## 2015-12-22 NOTE — Progress Notes (Signed)
Oncology Nurse Navigator Documentation  Oncology Nurse Navigator Flowsheets 12/22/2015  Navigator Encounter Type Other  Barriers/Navigation Needs Coordination of Care  Interventions Coordination of Care  Coordination of Care Other  Acuity Level 1  Time Spent with Patient 15   I received a call from [employer].  She asked if patient had an interpreter for chemo ed class.  I followed up with inclusion staff and he is set up for chemo ed class and subsequent appt's.  I update [place].

## 2015-12-23 ENCOUNTER — Ambulatory Visit: Payer: No Typology Code available for payment source

## 2015-12-23 ENCOUNTER — Other Ambulatory Visit (HOSPITAL_BASED_OUTPATIENT_CLINIC_OR_DEPARTMENT_OTHER): Payer: No Typology Code available for payment source

## 2015-12-23 ENCOUNTER — Other Ambulatory Visit: Payer: No Typology Code available for payment source

## 2015-12-23 ENCOUNTER — Encounter (HOSPITAL_COMMUNITY): Payer: Self-pay

## 2015-12-23 ENCOUNTER — Inpatient Hospital Stay (HOSPITAL_COMMUNITY)
Admission: AD | Admit: 2015-12-23 | Discharge: 2015-12-31 | DRG: 374 | Disposition: A | Discharge status: Hospice | Payer: Self-pay | Source: Ambulatory Visit | Attending: Internal Medicine | Admitting: Internal Medicine

## 2015-12-23 ENCOUNTER — Encounter: Payer: Self-pay | Admitting: Internal Medicine

## 2015-12-23 ENCOUNTER — Encounter: Payer: Self-pay | Admitting: *Deleted

## 2015-12-23 ENCOUNTER — Ambulatory Visit (HOSPITAL_BASED_OUTPATIENT_CLINIC_OR_DEPARTMENT_OTHER): Payer: No Typology Code available for payment source | Admitting: Internal Medicine

## 2015-12-23 ENCOUNTER — Other Ambulatory Visit: Payer: Self-pay | Admitting: Medical Oncology

## 2015-12-23 VITALS — BP 134/49 | HR 93 | Temp 98.9°F | Resp 18 | Ht 63.0 in | Wt 121.6 lb

## 2015-12-23 DIAGNOSIS — E46 Unspecified protein-calorie malnutrition: Secondary | ICD-10-CM | POA: Diagnosis present

## 2015-12-23 DIAGNOSIS — R5383 Other fatigue: Secondary | ICD-10-CM

## 2015-12-23 DIAGNOSIS — Z87891 Personal history of nicotine dependence: Secondary | ICD-10-CM

## 2015-12-23 DIAGNOSIS — C3432 Malignant neoplasm of lower lobe, left bronchus or lung: Secondary | ICD-10-CM

## 2015-12-23 DIAGNOSIS — Z515 Encounter for palliative care: Secondary | ICD-10-CM | POA: Insufficient documentation

## 2015-12-23 DIAGNOSIS — D508 Other iron deficiency anemias: Secondary | ICD-10-CM | POA: Diagnosis present

## 2015-12-23 DIAGNOSIS — R05 Cough: Secondary | ICD-10-CM

## 2015-12-23 DIAGNOSIS — D7589 Other specified diseases of blood and blood-forming organs: Secondary | ICD-10-CM | POA: Diagnosis present

## 2015-12-23 DIAGNOSIS — E871 Hypo-osmolality and hyponatremia: Secondary | ICD-10-CM | POA: Diagnosis present

## 2015-12-23 DIAGNOSIS — R109 Unspecified abdominal pain: Secondary | ICD-10-CM

## 2015-12-23 DIAGNOSIS — D649 Anemia, unspecified: Secondary | ICD-10-CM

## 2015-12-23 DIAGNOSIS — K921 Melena: Secondary | ICD-10-CM | POA: Diagnosis present

## 2015-12-23 DIAGNOSIS — K254 Chronic or unspecified gastric ulcer with hemorrhage: Secondary | ICD-10-CM | POA: Diagnosis present

## 2015-12-23 DIAGNOSIS — Z7189 Other specified counseling: Secondary | ICD-10-CM | POA: Insufficient documentation

## 2015-12-23 DIAGNOSIS — R5382 Chronic fatigue, unspecified: Secondary | ICD-10-CM

## 2015-12-23 DIAGNOSIS — D509 Iron deficiency anemia, unspecified: Secondary | ICD-10-CM | POA: Diagnosis present

## 2015-12-23 DIAGNOSIS — D62 Acute posthemorrhagic anemia: Secondary | ICD-10-CM | POA: Diagnosis present

## 2015-12-23 DIAGNOSIS — I10 Essential (primary) hypertension: Secondary | ICD-10-CM | POA: Diagnosis present

## 2015-12-23 DIAGNOSIS — Z91030 Bee allergy status: Secondary | ICD-10-CM

## 2015-12-23 DIAGNOSIS — Z789 Other specified health status: Secondary | ICD-10-CM

## 2015-12-23 DIAGNOSIS — R0609 Other forms of dyspnea: Secondary | ICD-10-CM

## 2015-12-23 DIAGNOSIS — C3492 Malignant neoplasm of unspecified part of left bronchus or lung: Secondary | ICD-10-CM | POA: Diagnosis present

## 2015-12-23 DIAGNOSIS — K922 Gastrointestinal hemorrhage, unspecified: Secondary | ICD-10-CM

## 2015-12-23 DIAGNOSIS — Z66 Do not resuscitate: Secondary | ICD-10-CM | POA: Diagnosis present

## 2015-12-23 DIAGNOSIS — D630 Anemia in neoplastic disease: Secondary | ICD-10-CM | POA: Diagnosis present

## 2015-12-23 DIAGNOSIS — C784 Secondary malignant neoplasm of small intestine: Principal | ICD-10-CM | POA: Diagnosis present

## 2015-12-23 DIAGNOSIS — R1084 Generalized abdominal pain: Secondary | ICD-10-CM

## 2015-12-23 DIAGNOSIS — Z79899 Other long term (current) drug therapy: Secondary | ICD-10-CM

## 2015-12-23 LAB — FOLATE: Folate: 8 ng/mL (ref >5.9)

## 2015-12-23 LAB — CBC WITH DIFFERENTIAL/PLATELET
BASO%: 0.2 % (ref 0.0–2.0)
BASOS ABS: 0 10*3/uL (ref 0.0–0.1)
EOS ABS: 0 10*3/uL (ref 0.0–0.5)
EOS%: 0.1 % (ref 0.0–7.0)
HCT: 18.7 % — ABNORMAL LOW (ref 38.4–49.9)
HEMOGLOBIN: 5.7 g/dL — AB (ref 13.0–17.1)
LYMPH%: 4.5 % — AB (ref 14.0–49.0)
MCH: 26 pg — AB (ref 27.2–33.4)
MCHC: 30.4 g/dL — AB (ref 32.0–36.0)
MCV: 85.5 fL (ref 79.3–98.0)
MONO#: 1.1 10*3/uL — ABNORMAL HIGH (ref 0.1–0.9)
MONO%: 6.4 % (ref 0.0–14.0)
NEUT#: 16 10*3/uL — ABNORMAL HIGH (ref 1.5–6.5)
NEUT%: 88.8 % — ABNORMAL HIGH (ref 39.0–75.0)
Platelets: 764 10*3/uL — ABNORMAL HIGH (ref 140–400)
RBC: 2.18 10*6/uL — AB (ref 4.20–5.82)
RDW: 18.4 % — AB (ref 11.0–14.6)
WBC: 18 10*3/uL — ABNORMAL HIGH (ref 4.0–10.3)
lymph#: 0.8 10*3/uL — ABNORMAL LOW (ref 0.9–3.3)

## 2015-12-23 LAB — VITAMIN B12: VITAMIN B 12: 397 pg/mL (ref 180–914)

## 2015-12-23 LAB — COMPREHENSIVE METABOLIC PANEL
ALK PHOS: 136 U/L (ref 40–150)
ALT: 9 U/L (ref 0–55)
AST: 17 U/L (ref 5–34)
Albumin: 2.3 g/dL — ABNORMAL LOW (ref 3.5–5.0)
Anion Gap: 8 mEq/L (ref 3–11)
BUN: 9.9 mg/dL (ref 7.0–26.0)
CHLORIDE: 95 meq/L — AB (ref 98–109)
CO2: 23 meq/L (ref 22–29)
Calcium: 8.5 mg/dL (ref 8.4–10.4)
Creatinine: 0.6 mg/dL — ABNORMAL LOW (ref 0.7–1.3)
GLUCOSE: 117 mg/dL (ref 70–140)
POTASSIUM: 4.2 meq/L (ref 3.5–5.1)
SODIUM: 126 meq/L — AB (ref 136–145)
Total Bilirubin: <0.3 mg/dL (ref 0.20–1.20)
Total Protein: 5.8 g/dL — ABNORMAL LOW (ref 6.4–8.3)

## 2015-12-23 LAB — OCCULT BLOOD X 1 CARD TO LAB, STOOL: FECAL OCCULT BLD: POSITIVE — AB

## 2015-12-23 LAB — TSH: TSH: 0.781 m(IU)/L (ref 0.320–4.118)

## 2015-12-23 LAB — IRON AND TIBC
Iron: 9 ug/dL — ABNORMAL LOW (ref 45–182)
Saturation Ratios: 2 % — ABNORMAL LOW (ref 17.9–39.5)
TIBC: 395 ug/dL (ref 250–450)
UIBC: 386 ug/dL

## 2015-12-23 LAB — RETICULOCYTES
RBC.: 1.9 MIL/uL — AB (ref 4.22–5.81)
RETIC CT PCT: 6.8 % — AB (ref 0.4–3.1)
Retic Count, Absolute: 129.2 10*3/uL (ref 19.0–186.0)

## 2015-12-23 LAB — PREPARE RBC (CROSSMATCH)

## 2015-12-23 LAB — FERRITIN: FERRITIN: 11 ng/mL — AB (ref 24–336)

## 2015-12-23 MED ORDER — FOLIC ACID 1 MG PO TABS
1.0000 mg | ORAL_TABLET | Freq: Every day | ORAL | Status: DC
  Administered 2015-12-23 – 2015-12-31 (×9): 1 mg via ORAL
  Filled 2015-12-23 (×9): qty 1

## 2015-12-23 MED ORDER — LISINOPRIL-HYDROCHLOROTHIAZIDE 20-25 MG PO TABS: 1.0000 | ORAL_TABLET | Freq: Every day | ORAL | Status: DC

## 2015-12-23 MED ORDER — FAMOTIDINE IN NACL 20-0.9 MG/50ML-% IV SOLN
20.0000 mg | Freq: Two times a day (BID) | INTRAVENOUS | Status: DC
  Administered 2015-12-23 – 2015-12-24 (×2): 20 mg via INTRAVENOUS
  Filled 2015-12-23 (×2): qty 50

## 2015-12-23 MED ORDER — SODIUM CHLORIDE 0.9 % IV SOLN: Freq: Once | INTRAVENOUS | Status: DC

## 2015-12-23 MED ORDER — ONDANSETRON HCL 4 MG PO TABS
4.0000 mg | ORAL_TABLET | Freq: Four times a day (QID) | ORAL | Status: DC | PRN
Start: 2015-12-23 — End: 2015-12-31

## 2015-12-23 MED ORDER — LISINOPRIL 20 MG PO TABS
20.0000 mg | ORAL_TABLET | Freq: Every day | ORAL | Status: DC
  Administered 2015-12-23 – 2015-12-31 (×9): 20 mg via ORAL
  Filled 2015-12-23 (×9): qty 1

## 2015-12-23 MED ORDER — HYDROCHLOROTHIAZIDE 25 MG PO TABS
25.0000 mg | ORAL_TABLET | Freq: Every day | ORAL | Status: DC
  Administered 2015-12-23: 25 mg via ORAL
  Filled 2015-12-23: qty 1

## 2015-12-23 MED ORDER — ONDANSETRON HCL 4 MG/2ML IJ SOLN: 4.0000 mg | Freq: Four times a day (QID) | INTRAMUSCULAR | Status: DC | PRN

## 2015-12-23 MED ORDER — ACETAMINOPHEN 325 MG PO TABS
650.0000 mg | ORAL_TABLET | Freq: Four times a day (QID) | ORAL | Status: DC | PRN
Start: 2015-12-23 — End: 2015-12-31
  Administered 2015-12-29 – 2015-12-31 (×4): 650 mg via ORAL
  Filled 2015-12-23 (×4): qty 2

## 2015-12-23 MED ORDER — SENNOSIDES-DOCUSATE SODIUM 8.6-50 MG PO TABS: 1.0000 | ORAL_TABLET | Freq: Every evening | ORAL | Status: DC | PRN

## 2015-12-23 MED ORDER — POTASSIUM CHLORIDE IN NACL 20-0.9 MEQ/L-% IV SOLN
INTRAVENOUS | Status: DC
  Administered 2015-12-23 – 2015-12-29 (×10): via INTRAVENOUS
  Filled 2015-12-23 (×13): qty 1000

## 2015-12-23 NOTE — Progress Notes (Signed)
Met w/ pt & was told he completed a Editor, commissioning w/ Butch Penny at [employer].  He is waiting for approval.

## 2015-12-23 NOTE — Progress Notes (Signed)
[place] [phone]   Fax:([phone]) [place], [place] [address]  DIAGNOSIS: Stage IV (T3, N3, M1b) non-small cell lung cancer, adenocarcinoma presented with several pulmonary nodules in the left upper and lower lobes in addition to right paratracheal lymphadenopathy in addition to right upper lobe pulmonary nodule and right adrenal metastasis diagnosed in May 2017. Plasma test for EGFR mutation was negative.  PRIOR THERAPY: None  CURRENT THERAPY: Systemic chemotherapy with carboplatin for AUC of 5, Alimta 500 MG/M2 and Ketruda (pembrolizumab) 200 MG IV every 3 weeks. The patient was supposed to start the first cycle of his treatment on 12/23/2015 but this is currently on hold secondary to his hospital admission.  INTERVAL HISTORY: David Warren 60 y.o. male returns to the clinic today for follow-up visit accompanied by several family members including 2 of his daughters and his wife. The patient is feeling fine today with no specific complaints except for increasing fatigue. He also complains of abdominal pain and black tarry stool. He denied having any current hemoptysis. He denied having any significant chest pain but has shortness of breath with exertion and cough. He denied having any significant weight loss or night sweats. He has no nausea or vomiting, no fever or chills. He was supposed to start the first cycle of his systemic chemotherapy today but he was found to have significant decline in his hemoglobin and hematocrit.  MEDICAL HISTORY: Past Medical History  Diagnosis Date  . Tobacco abuse   . Lipoma of neck   . Hypertension   . Seizures ([place])     once several years ago  . Adenocarcinoma of left lung, stage 3 ([place]) 11/13/2015    ALLERGIES:  is allergic to bee venom.  MEDICATIONS:  Current Outpatient Prescriptions  Medication Sig Dispense Refill  . acetaminophen (TYLENOL) 500 MG  tablet Take 650 mg by mouth every 6 (six) hours as needed.    . folic acid (FOLVITE) 1 MG tablet Take 1 tablet (1 mg total) by mouth daily. 30 tablet 4  . lisinopril-hydrochlorothiazide (PRINZIDE,ZESTORETIC) 20-25 MG tablet Take 1 tablet by mouth daily.    Marland Kitchen ibuprofen (ADVIL,MOTRIN) 200 MG tablet Take 400 mg by mouth every 6 (six) hours as needed (pain). Reported on 12/23/2015    . prochlorperazine (COMPAZINE) 10 MG tablet Take 1 tablet (10 mg total) by mouth every 6 (six) hours as needed for nausea or vomiting. (Patient not taking: Reported on 12/23/2015) 30 tablet 0   No current facility-administered medications for this visit.    SURGICAL HISTORY:  Past Surgical History  Procedure Laterality Date  . No past surgeries    . Electromagnetic navigation brochoscopy N/A 11/05/2015    Procedure: ELECTROMAGNETIC NAVIGATION BRONCHOSCOPY;  Surgeon: Flora Lipps, MD;  Location: [facility] ORS;  Service: Cardiopulmonary;  Laterality: N/A;  . Endobronchial ultrasound N/A 11/05/2015    Procedure: ENDOBRONCHIAL ULTRASOUND;  Surgeon: Flora Lipps, MD;  Location: [facility] ORS;  Service: Cardiopulmonary;  Laterality: N/A;    REVIEW OF SYSTEMS:  Constitutional: positive for fatigue Eyes: negative Ears, nose, mouth, throat, and face: negative Respiratory: positive for dyspnea on exertion Cardiovascular: negative Gastrointestinal: positive for abdominal pain and melena Genitourinary:negative Integument/breast: negative Hematologic/lymphatic: negative Musculoskeletal:negative Neurological: negative Behavioral/Psych: negative Endocrine: negative Allergic/Immunologic: negative   PHYSICAL EXAMINATION: General appearance: alert, cooperative, fatigued and no distress Head: Normocephalic, without obvious abnormality, atraumatic Neck: no adenopathy, no JVD, supple, symmetrical, trachea midline, thyroid not  enlarged, symmetric, no tenderness/mass/nodules and Large lipoma on the back of the neck Lymph nodes: Cervical,  supraclavicular, and axillary nodes normal. Resp: clear to auscultation bilaterally Back: symmetric, no curvature. ROM normal. No CVA tenderness. Cardio: regular rate and rhythm, S1, S2 normal, no murmur, click, rub or gallop GI: soft, non-tender; bowel sounds normal; no masses,  no organomegaly Extremities: extremities normal, atraumatic, no cyanosis or edema Neurologic: Alert and oriented X 3, normal strength and tone. Normal symmetric reflexes. Normal coordination and gait  ECOG PERFORMANCE STATUS: 1 - Symptomatic but completely ambulatory  Blood pressure 134/49, pulse 93, temperature 98.9 F (37.2 C), temperature source Oral, resp. rate 18, height '5\' 3"'$  (1.6 m), weight 121 lb 9.6 oz (55.157 kg), SpO2 100 %.  LABORATORY DATA: Lab Results  Component Value Date   WBC 18.0* 12/23/2015   HGB 5.7* 12/23/2015   HCT 18.7* 12/23/2015   MCV 85.5 12/23/2015   PLT 764* 12/23/2015      Chemistry      Component Value Date/Time   NA 126* 12/23/2015 1142   NA 126* 10/30/2015 1105   K 4.2 12/23/2015 1142   K 3.7 10/30/2015 1105   CL 93* 10/30/2015 1105   CO2 23 12/23/2015 1142   CO2 20* 10/30/2015 1105   BUN 9.9 12/23/2015 1142   BUN 7 10/30/2015 1105   CREATININE 0.6* 12/23/2015 1142   CREATININE 0.42* 10/30/2015 1105      Component Value Date/Time   CALCIUM 8.5 12/23/2015 1142   CALCIUM 9.2 10/30/2015 1105   ALKPHOS 136 12/23/2015 1142   ALKPHOS 157* 10/20/2015 1315   AST 17 12/23/2015 1142   AST 86* 10/20/2015 1315   ALT 9 12/23/2015 1142   ALT 49 10/20/2015 1315   BILITOT <0.30 12/23/2015 1142   BILITOT 1.0 10/20/2015 1315       RADIOGRAPHIC STUDIES: No results found.  ASSESSMENT AND PLAN: This is a very pleasant 60 years old Hispanic male with stage IV non-small cell lung cancer, adenocarcinoma with negative EGFR  And BRAF mutations.  The patient was supposed to start today the first cycle of palliative systemic chemotherapy in combination with immunotherapy in the  form of carboplatin for AUC of 5, Alimta 500 MG/M2 and Ketruda (pembrolizumab) 200 MG IV every 3 weeks. Unfortunately he was found to have significant decline in his hemoglobin and hematocrit with hemoglobin down to 5.7 concerning for gastrointestinal. I recommended for the patient to hold his chemotherapy for now. For the suspicious gastrointestinal bleeding and severe anemia, I will arrange for the patient to be admitted to [facility] for further evaluation by gastroenterology and consideration of PRBCs transfusion for his anemia. I will reschedule his chemotherapy to be done one week after his discharge from the hospital. The patient his family agreed to the current plan. He was advised to call immediately if he has any concerning symptoms in the interval. The patient voices understanding of current disease status and treatment options and is in agreement with the current care plan.  All questions were answered. The patient knows to call the clinic with any problems, questions or concerns. We can certainly see the patient much sooner if necessary.  I spent 15 minutes counseling the patient face to face. The total time spent in the appointment was 25 minutes.  Disclaimer: This note was dictated with voice recognition software. Similar sounding words can inadvertently be transcribed and may not be corrected upon review.

## 2015-12-23 NOTE — H&P (Signed)
History and Physical  Eesa Justiss QQI:[id number] DOB: 31-Aug-1955 DOA: 12/23/2015   PCP: Maren Reamer, MD   Patient coming from: Oncology clinic   Chief Complaint: anemia   HPI: David Warren is a 60 y.o. male with medical history significant for recently diagnosed stage 3 lung adenocarcinoma who was seen in Oncology clinic today to initiate chemotherapy and was found to be anemic. History is taken from the patient and family, including 2 daughters and wife who help translate as well. The patient had lung biopsy via bronchoscopy in May 2017 and had a couple days of hematemesis then. Since that time, he has not had any nausea, vomiting or cough. He has some melena a few weeks ago, but it got better, and then about 2-3 weeks ago the melena has come back. Over the last two days, the patient has been weak, family notes he looks a little yellow. The last two days, he was weak to the point it felt his legs were giving out from under him, but he denies any syncope, falls, loss of consciousness. He has never had a GI bleed before, and denies any prior GI consultation, endoscopy or colonoscopy.  Review of Systems: Please see HPI for pertinent positives and negatives. A complete 10 system review of systems are otherwise negative.  Past Medical History  Diagnosis Date  . Tobacco abuse   . Lipoma of neck   . Hypertension   . Seizures ([place])     once several years ago  . Adenocarcinoma of left lung, stage 3 ([place]) 11/13/2015   Past Surgical History  Procedure Laterality Date  . No past surgeries    . Electromagnetic navigation brochoscopy N/A 11/05/2015    Procedure: ELECTROMAGNETIC NAVIGATION BRONCHOSCOPY;  Surgeon: Flora Lipps, MD;  Location: [facility] ORS;  Service: Cardiopulmonary;  Laterality: N/A;  . Endobronchial ultrasound N/A 11/05/2015    Procedure: ENDOBRONCHIAL ULTRASOUND;  Surgeon: Flora Lipps, MD;  Location: [facility] ORS;  Service: Cardiopulmonary;  Laterality: N/A;   Social History:  reports  that he quit smoking about 7 weeks ago. His smoking use included Cigarettes. He has a 35 pack-year smoking history. He has never used smokeless tobacco. He reports that he drinks about 7.2 oz of alcohol per week. He reports that he uses illicit drugs (Cocaine).   Allergies  Allergen Reactions  . Bee Venom Anaphylaxis    Family History  Problem Relation Age of Onset  . Family history unknown: Yes     Prior to Admission medications   Medication Sig Start Date End Date Taking? Authorizing Provider  acetaminophen (TYLENOL) 500 MG tablet Take 650 mg by mouth every 6 (six) hours as needed.   Yes Historical Provider, MD  lisinopril-hydrochlorothiazide (PRINZIDE,ZESTORETIC) 20-25 MG tablet Take 1 tablet by mouth daily.   Yes Historical Provider, MD  folic acid (FOLVITE) 1 MG tablet Take 1 tablet (1 mg total) by mouth daily. 12/05/15   Curt Bears, MD  prochlorperazine (COMPAZINE) 10 MG tablet Take 1 tablet (10 mg total) by mouth every 6 (six) hours as needed for nausea or vomiting. Patient not taking: Reported on 12/23/2015 12/05/15   Curt Bears, MD   Physical Exam: BP 135/39 mmHg  Pulse 96  Temp(Src) 98.3 F (36.8 C) (Oral)  Resp 20  Wt 53.978 kg (119 lb)  SpO2 100%  General:  Alert, oriented, calm, in no acute distress  Eyes: pupils round and reactive to light and accomodation, clear sclerea Neck: supple, he has large lipoma on posterior neck soft, nontender,  trachea mildline  Cardiovascular: RRR, no murmurs or rubs, no peripheral edema  Respiratory: clear to auscultation bilaterally, no wheezes, no crackles  Abdomen: soft, nontender, nondistended, normal bowel tones heard  Skin: dry, no rashes  Musculoskeletal: no joint effusions, normal range of motion  Psychiatric: appropriate affect, normal speech  Neurologic: extraocular muscles intact, clear speech, moving all extremities with intact sensorium           Labs on Admission:  Basic Metabolic Panel:  Recent Labs Lab  12/23/15 1142  NA 126*  K 4.2  CO2 23  GLUCOSE 117  BUN 9.9  CREATININE 0.6*  CALCIUM 8.5   Liver Function Tests:  Recent Labs Lab 12/23/15 1142  AST 17  ALT 9  ALKPHOS 136  BILITOT <0.30  PROT 5.8*  ALBUMIN 2.3*   No results for input(s): LIPASE, AMYLASE in the last 168 hours. No results for input(s): AMMONIA in the last 168 hours. CBC:  Recent Labs Lab 12/23/15 1142  WBC 18.0*  NEUTROABS 16.0*  HGB 5.7*  HCT 18.7*  MCV 85.5  PLT 764*   Cardiac Enzymes: No results for input(s): CKTOTAL, CKMB, CKMBINDEX, TROPONINI in the last 168 hours.  BNP (last 3 results) No results for input(s): BNP in the last 8760 hours.  ProBNP (last 3 results) No results for input(s): PROBNP in the last 8760 hours.  CBG: No results for input(s): GLUCAP in the last 168 hours.  Radiological Exams on Admission: No results found.  Assessment/Plan Present on Admission:  . Adenocarcinoma of left lung, stage 4 (H[facility]) - under the care of Dr. Earlie Server, stage 3 - chemo on hold for now until anemia is treated . Iron deficiency anemia - suspect GI source given melena - NPO now - IV famotidine BID (protonix shortage, reserved for critical acute bleeds) - [facility] consulted - check anemia panel - FOBT ordered - transfuse 2 units of pRBCs now, goal Hb >7 . Acute blood loss anemia Principal Problem:   Iron deficiency anemia Active Problems:   Adenocarcinoma of left lung, stage 4 (HCC)   Acute blood loss anemia   DVT prophylaxis: SCDs   Code Status: FULL   Family Communication: Wife and 2 daughters at bedside.   Disposition Plan: To home in 2-3 days.   Consults called: [facility]   Admission status: Inpatient, on telemetry.   Time spent: 62 minutes  David Warren Marry Guan MD [facility] Pager [phone]  If 7PM-7AM, please contact night-coverage [email or web address] Password [facility]  12/23/2015, 2:49 PM

## 2015-12-24 ENCOUNTER — Encounter (HOSPITAL_COMMUNITY)
Admission: AD | Disposition: A | Discharge status: Hospice | Payer: Self-pay | Source: Ambulatory Visit | Attending: Internal Medicine

## 2015-12-24 ENCOUNTER — Inpatient Hospital Stay (HOSPITAL_COMMUNITY): Payer: Self-pay | Admitting: Anesthesiology

## 2015-12-24 ENCOUNTER — Encounter (HOSPITAL_COMMUNITY): Payer: Self-pay

## 2015-12-24 DIAGNOSIS — K921 Melena: Secondary | ICD-10-CM | POA: Diagnosis present

## 2015-12-24 HISTORY — PX: ESOPHAGOGASTRODUODENOSCOPY (EGD) WITH PROPOFOL: SHX5813

## 2015-12-24 LAB — BASIC METABOLIC PANEL
ANION GAP: 7 (ref 5–15)
BUN: 11 mg/dL (ref 6–20)
CALCIUM: 7.7 mg/dL — AB (ref 8.9–10.3)
CO2: 23 mmol/L (ref 22–32)
Chloride: 96 mmol/L — ABNORMAL LOW (ref 101–111)
Creatinine, Ser: 0.38 mg/dL — ABNORMAL LOW (ref 0.61–1.24)
GFR calc non Af Amer: >60 mL/min (ref >60)
Glucose, Bld: 99 mg/dL (ref 65–99)
Potassium: 3.9 mmol/L (ref 3.5–5.1)
Sodium: 126 mmol/L — ABNORMAL LOW (ref 135–145)

## 2015-12-24 LAB — CBC
HEMATOCRIT: 22.1 % — AB (ref 39.0–52.0)
Hemoglobin: 7.3 g/dL — ABNORMAL LOW (ref 13.0–17.0)
MCH: 27.7 pg (ref 26.0–34.0)
MCHC: 33 g/dL (ref 30.0–36.0)
MCV: 83.7 fL (ref 78.0–100.0)
Platelets: 598 10*3/uL — ABNORMAL HIGH (ref 150–400)
RBC: 2.64 MIL/uL — ABNORMAL LOW (ref 4.22–5.81)
RDW: 15.4 % (ref 11.5–15.5)
WBC: 14.9 10*3/uL — AB (ref 4.0–10.5)

## 2015-12-24 LAB — OCCULT BLOOD X 1 CARD TO LAB, STOOL
FECAL OCCULT BLD: POSITIVE — AB
FECAL OCCULT BLD: POSITIVE — AB

## 2015-12-24 SURGERY — ESOPHAGOGASTRODUODENOSCOPY (EGD) WITH PROPOFOL
Anesthesia: Monitor Anesthesia Care

## 2015-12-24 MED ORDER — PROPOFOL 500 MG/50ML IV EMUL
INTRAVENOUS | Status: DC | PRN
Start: 2015-12-24 — End: 2015-12-24
  Administered 2015-12-24: 140 ug/kg/min via INTRAVENOUS

## 2015-12-24 MED ORDER — LIDOCAINE HCL (CARDIAC) 20 MG/ML IV SOLN
INTRAVENOUS | Status: AC
  Filled 2015-12-24: qty 5

## 2015-12-24 MED ORDER — SODIUM CHLORIDE 0.9 % IV SOLN: INTRAVENOUS | Status: DC

## 2015-12-24 MED ORDER — PANTOPRAZOLE SODIUM 40 MG IV SOLR
40.0000 mg | Freq: Two times a day (BID) | INTRAVENOUS | Status: DC
  Administered 2015-12-27 – 2015-12-31 (×8): 40 mg via INTRAVENOUS
  Filled 2015-12-24 (×8): qty 40

## 2015-12-24 MED ORDER — PANTOPRAZOLE SODIUM 40 MG IV SOLR
80.0000 mg | Freq: Once | INTRAVENOUS | Status: AC
  Administered 2015-12-24: 80 mg via INTRAVENOUS
  Filled 2015-12-24: qty 80

## 2015-12-24 MED ORDER — PROPOFOL 500 MG/50ML IV EMUL
INTRAVENOUS | Status: DC | PRN
  Administered 2015-12-24: 20 mg via INTRAVENOUS
  Administered 2015-12-24: 50 mg via INTRAVENOUS
  Administered 2015-12-24: 20 mg via INTRAVENOUS
  Administered 2015-12-24: 30 mg via INTRAVENOUS

## 2015-12-24 MED ORDER — SODIUM CHLORIDE 0.9 % IV SOLN
8.0000 mg/h | INTRAVENOUS | Status: AC
  Administered 2015-12-24 – 2015-12-27 (×7): 8 mg/h via INTRAVENOUS
  Filled 2015-12-24: qty 40
  Filled 2015-12-24 (×10): qty 80

## 2015-12-24 MED ORDER — PROPOFOL 10 MG/ML IV BOLUS
INTRAVENOUS | Status: AC
  Filled 2015-12-24: qty 40

## 2015-12-24 SURGICAL SUPPLY — 15 items

## 2015-12-24 NOTE — Anesthesia Postprocedure Evaluation (Signed)
Anesthesia Post Note  Patient: David Warren  Procedure(s) Performed: Procedure(s) (LRB): ESOPHAGOGASTRODUODENOSCOPY (EGD) WITH PROPOFOL (N/A)  Patient location during evaluation: PACU Anesthesia Type: MAC Level of consciousness: awake and alert Pain management: pain level controlled Vital Signs Assessment: post-procedure vital signs reviewed and stable Respiratory status: spontaneous breathing and respiratory function stable Cardiovascular status: stable Anesthetic complications: no    Last Vitals:  Filed Vitals:   12/24/15 1210 12/24/15 1220  BP: 118/76 135/43  Pulse: 92 85  Temp: 36.7 C   Resp: 23 15    Last Pain:  Filed Vitals:   12/24/15 1224  PainSc: 4                  Damontay Alred DANIEL

## 2015-12-24 NOTE — Progress Notes (Signed)
PROGRESS NOTE  David Warren GDJ:[id number] DOB: 1956-04-18 DOA: 12/23/2015 PCP: Maren Reamer, MD  HPI/Recap of past 24 hours: 60 y.o. male with medical history significant for recently diagnosed stage 3 lung adenocarcinoma who was seen in Oncology clinic today to initiate chemotherapy and was found to be anemic. Tolerated 2u pRBC last night with good response, family at bedside this AM and he denies any pain, nausea, or BM overnight.   Assessment/Plan: . Adenocarcinoma of left lung, stage 4 (HCC) - under the care of Dr. Earlie Server, stage 3 - chemo on hold for now until anemia is treated . Iron deficiency anemia - suspect GI source given melena - NPO now - IV famotidine BID (protonix shortage, reserved for critical acute bleeds) - [facility] consulted, Dr. Michail Sermon to see for EGD today - transfuse to keep Hb >7 - FOBT positive . Acute blood loss anemia Hyponatremia - not symptomatic, but will hold his HCTZ today, increase IVF and add fluid restriction. Principal Problem:  Iron deficiency anemia Active Problems:  Adenocarcinoma of left lung, stage 4 (HCC)  Acute blood loss anemia  Code Status: FULL   Family Communication: Discussed with daughter and wife in addition to patient.   Disposition Plan: Likely to home in 1-2 days pending GI workup.   Consultants:  GI Dr. Michail Sermon   Procedures:  Plan EGD 6/29   Antimicrobials:  None    Objective: Filed Vitals:   12/23/15 2003 12/23/15 2030 12/23/15 2219 12/24/15 0617  BP: 137/60 140/59 141/65 138/47  Pulse: 91 94 98 89  Temp: 98.7 F (37.1 C) 98.9 F (37.2 C) 99.5 F (37.5 C) 98.5 F (36.9 C)  TempSrc: Oral Oral Oral Oral  Resp: '18 18 20 18  '$ Height:      Weight:      SpO2: 100% 100% 100% 100%    Intake/Output Summary (Last 24 hours) at 12/24/15 0804 Last data filed at 12/24/15 0700  Gross per 24 hour  Intake 1065.83 ml  Output      0 ml  Net 1065.83 ml   Filed Weights   12/23/15 1331  Weight: 53.978 kg  (119 lb)    Exam:  General: Alert, oriented, calm, in no acute distress   Eyes: pupils round and reactive to light and accomodation, clear sclerea  Neck: supple, he has large lipoma on posterior neck soft, nontender, trachea mildline   Cardiovascular: RRR, no murmurs or rubs, no peripheral edema   Respiratory: clear to auscultation bilaterally, no wheezes, no crackles   Abdomen: soft, nontender, nondistended, normal bowel tones heard   Skin: dry, no rashes   Musculoskeletal: no joint effusions, normal range of motion   Psychiatric: appropriate affect, normal speech   Neurologic: extraocular muscles intact, clear speech, moving all extremities with intact sensorium   Data Reviewed: CBC:  Recent Labs Lab 12/23/15 1142 12/24/15 0058  WBC 18.0* 14.9*  NEUTROABS 16.0*  --   HGB 5.7* 7.3*  HCT 18.7* 22.1*  MCV 85.5 83.7  PLT 764* 622*   Basic Metabolic Panel:  Recent Labs Lab 12/23/15 1142 12/24/15 0058  NA 126* 126*  K 4.2 3.9  CL  --  96*  CO2 23 23  GLUCOSE 117 99  BUN 9.9 11  CREATININE 0.6* 0.38*  CALCIUM 8.5 7.7*   GFR: Estimated Creatinine Clearance: 74.1 mL/min (by C-G formula based on Cr of 0.38). Liver Function Tests:  Recent Labs Lab 12/23/15 1142  AST 17  ALT 9  ALKPHOS 136  BILITOT <  0.30  PROT 5.8*  ALBUMIN 2.3*   No results for input(s): LIPASE, AMYLASE in the last 168 hours. No results for input(s): AMMONIA in the last 168 hours. Coagulation Profile: No results for input(s): INR, PROTIME in the last 168 hours. Cardiac Enzymes: No results for input(s): CKTOTAL, CKMB, CKMBINDEX, TROPONINI in the last 168 hours. BNP (last 3 results) No results for input(s): PROBNP in the last 8760 hours. HbA1C: No results for input(s): HGBA1C in the last 72 hours. CBG: No results for input(s): GLUCAP in the last 168 hours. Lipid Profile: No results for input(s): CHOL, HDL, LDLCALC, TRIG, CHOLHDL, LDLDIRECT in the last 72 hours. Thyroid  Function Tests:  Recent Labs  12/23/15 1142  TSH 0.781   Anemia Panel:  Recent Labs  12/23/15 1421  VITAMINB12 397  FOLATE 8.0  FERRITIN 11*  TIBC 395  IRON 9*  RETICCTPCT 6.8*   Urine analysis: No results found for: COLORURINE, APPEARANCEUR, LABSPEC, PHURINE, GLUCOSEU, HGBUR, BILIRUBINUR, KETONESUR, PROTEINUR, UROBILINOGEN, NITRITE, LEUKOCYTESUR Sepsis Labs: '@LABRCNTIP'$ (procalcitonin:4,lacticidven:4)  )No results found for this or any previous visit (from the past 240 hour(s)).    Studies: No results found.  Scheduled Meds: . famotidine (PEPCID) IV  20 mg Intravenous Q12H  . folic acid  1 mg Oral Daily  . lisinopril  20 mg Oral Daily   And  . hydrochlorothiazide  25 mg Oral Daily    Continuous Infusions: . 0.9 % NaCl with KCl 20 mEq / L 50 mL/hr at 12/23/15 2314     LOS: 1 day   Time spent: 24 minutes  David Komatsu Marry Guan, MD [facility] Pager [phone]  If 7PM-7AM, please contact night-coverage [email or web address] Password [facility] 12/24/2015, 8:04 AM

## 2015-12-24 NOTE — Consult Note (Signed)
Referring Provider: Dr. Hollice Gong Primary Care Physician:  Maren Reamer, MD Primary Gastroenterologist:  Althia Forts  Reason for Consultation:  Melena  HPI: David Warren is a 60 y.o. male with a recent diagnosis of lung cancer who was due to start chemotherapy this week but was admitted when he was found to be anemic with a Hgb 5.7. S/P 2 U PRBCs and Hgb 7.3 today. He had a lung biopsy in May and reportedly had hematemesis following that. Family denies hemoptysis. He had black tarry stools 2-3 weeks ago that resolved and came back this week when he had loose black stools. Denies hematochezia or recent hematemesis. Having lower abdominal pain. Has lost 5 pounds in the past few weeks. Patient does not speak [place] and daughters translate for him. No previous EGD or colonoscopy. Has a large lipoma of his neck per his daughter.   Past Medical History  Diagnosis Date  . Tobacco abuse   . Lipoma of neck   . Hypertension   . Seizures ([place])     once several years ago  . Adenocarcinoma of left lung, stage 3 ([place]) 11/13/2015    Past Surgical History  Procedure Laterality Date  . No past surgeries    . Electromagnetic navigation brochoscopy N/A 11/05/2015    Procedure: ELECTROMAGNETIC NAVIGATION BRONCHOSCOPY;  Surgeon: Flora Lipps, MD;  Location: [facility] ORS;  Service: Cardiopulmonary;  Laterality: N/A;  . Endobronchial ultrasound N/A 11/05/2015    Procedure: ENDOBRONCHIAL ULTRASOUND;  Surgeon: Flora Lipps, MD;  Location: [facility] ORS;  Service: Cardiopulmonary;  Laterality: N/A;    Prior to Admission medications   Medication Sig Start Date End Date Taking? Authorizing Provider  acetaminophen (TYLENOL) 500 MG tablet Take 650 mg by mouth every 6 (six) hours as needed.   Yes Historical Provider, MD  lisinopril-hydrochlorothiazide (PRINZIDE,ZESTORETIC) 20-25 MG tablet Take 1 tablet by mouth daily.   Yes Historical Provider, MD  folic acid (FOLVITE) 1 MG tablet Take 1 tablet (1 mg total) by mouth daily.  12/05/15   Curt Bears, MD  prochlorperazine (COMPAZINE) 10 MG tablet Take 1 tablet (10 mg total) by mouth every 6 (six) hours as needed for nausea or vomiting. Patient not taking: Reported on 12/23/2015 12/05/15   Curt Bears, MD    Scheduled Meds: . [MAR Hold] famotidine (PEPCID) IV  20 mg Intravenous Q12H  . [MAR Hold] folic acid  1 mg Oral Daily  . [MAR Hold] lisinopril  20 mg Oral Daily   Continuous Infusions: . sodium chloride    . 0.9 % NaCl with KCl 20 mEq / L 75 mL/hr at 12/24/15 0810   PRN Meds:.[MAR Hold] acetaminophen, [MAR Hold] ondansetron **OR** [MAR Hold] ondansetron (ZOFRAN) IV, [MAR Hold] senna-docusate  Allergies as of 12/23/2015 - Review Complete 12/23/2015  Allergen Reaction Noted  . Bee venom Anaphylaxis 01/31/2014    Family History  Problem Relation Age of Onset  . Family history unknown: Yes    Social History   Social History  . Marital Status: Married    Spouse Name: N/A  . Number of Children: N/A  . Years of Education: N/A   Occupational History  . Not on file.   Social History Main Topics  . Smoking status: Former Smoker -- 1.00 packs/day for 35 years    Types: Cigarettes    Quit date: 10/30/2015  . Smokeless tobacco: Never Used  . Alcohol Use: 7.2 oz/week    12 Cans of beer per week     Comment: 2-6 cans beer daily,  last drink yesterday  . Drug Use: Yes    Special: Cocaine  . Sexual Activity: Yes   Other Topics Concern  . Not on file   Social History Narrative   ** Merged History Encounter **        Review of Systems: All negative except as stated above in HPI.  Physical Exam: Vital signs: Filed Vitals:   12/24/15 0918 12/24/15 1004  BP: 139/50 151/46  Pulse: 85 88  Temp: 98.3 F (36.8 C) 98.6 F (37 C)  Resp: 18 14   Last BM Date: 12/24/15 General:   Alert, thin, pleasant and cooperative in NAD Head: atraumatic Eyes: anicteric sclera ENT: oropharynx clear Neck: large fullness bilaterally of neck,  nontender Lungs:  Clear throughout to auscultation.   No wheezes, crackles, or rhonchi. No acute distress. Heart:  Regular rate and rhythm; no murmurs, clicks, rubs,  or gallops. Abdomen: diffuse tenderness with guarding (greatest in periumbilical area), soft, nondistended, +BS  Rectal:  Deferred Ext: no edema  GI:  Lab Results:  Recent Labs  12/23/15 1142 12/24/15 0058  WBC 18.0* 14.9*  HGB 5.7* 7.3*  HCT 18.7* 22.1*  PLT 764* 598*   BMET  Recent Labs  12/23/15 1142 12/24/15 0058  NA 126* 126*  K 4.2 3.9  CL  --  96*  CO2 23 23  GLUCOSE 117 99  BUN 9.9 11  CREATININE 0.6* 0.38*  CALCIUM 8.5 7.7*   LFT  Recent Labs  12/23/15 1142  PROT 5.8*  ALBUMIN 2.3*  AST 17  ALT 9  ALKPHOS 136  BILITOT <0.30   PT/INR No results for input(s): LABPROT, INR in the last 72 hours.   Studies/Results: No results found.  Impression/Plan: 60 yo with severe anemia and recent melena concerning for a peptic ulcer bleed. NPO for EGD today. May need Protonix drip if complicated ulcer noted but will await EGD findings. Supportive care.     LOS: 1 day   Secor C.  12/24/2015, 11:36 AM  Pager [phone]  If no answer or after 5 PM call [phone]

## 2015-12-24 NOTE — H&P (View-Only) (Signed)
Referring Provider: Dr. Hollice Gong Primary Care Physician:  Maren Reamer, MD Primary Gastroenterologist:  Althia Forts  Reason for Consultation:  Melena  HPI: David Warren is a 60 y.o. male with a recent diagnosis of lung cancer who was due to start chemotherapy this week but was admitted when he was found to be anemic with a Hgb 5.7. S/P 2 U PRBCs and Hgb 7.3 today. He had a lung biopsy in May and reportedly had hematemesis following that. Family denies hemoptysis. He had black tarry stools 2-3 weeks ago that resolved and came back this week when he had loose black stools. Denies hematochezia or recent hematemesis. Having lower abdominal pain. Has lost 5 pounds in the past few weeks. Patient does not speak [place] and daughters translate for him. No previous EGD or colonoscopy. Has a large lipoma of his neck per his daughter.   Past Medical History  Diagnosis Date  . Tobacco abuse   . Lipoma of neck   . Hypertension   . Seizures ([place])     once several years ago  . Adenocarcinoma of left lung, stage 3 ([place]) 11/13/2015    Past Surgical History  Procedure Laterality Date  . No past surgeries    . Electromagnetic navigation brochoscopy N/A 11/05/2015    Procedure: ELECTROMAGNETIC NAVIGATION BRONCHOSCOPY;  Surgeon: Flora Lipps, MD;  Location: [facility] ORS;  Service: Cardiopulmonary;  Laterality: N/A;  . Endobronchial ultrasound N/A 11/05/2015    Procedure: ENDOBRONCHIAL ULTRASOUND;  Surgeon: Flora Lipps, MD;  Location: [facility] ORS;  Service: Cardiopulmonary;  Laterality: N/A;    Prior to Admission medications   Medication Sig Start Date End Date Taking? Authorizing Provider  acetaminophen (TYLENOL) 500 MG tablet Take 650 mg by mouth every 6 (six) hours as needed.   Yes Historical Provider, MD  lisinopril-hydrochlorothiazide (PRINZIDE,ZESTORETIC) 20-25 MG tablet Take 1 tablet by mouth daily.   Yes Historical Provider, MD  folic acid (FOLVITE) 1 MG tablet Take 1 tablet (1 mg total) by mouth daily.  12/05/15   Curt Bears, MD  prochlorperazine (COMPAZINE) 10 MG tablet Take 1 tablet (10 mg total) by mouth every 6 (six) hours as needed for nausea or vomiting. Patient not taking: Reported on 12/23/2015 12/05/15   Curt Bears, MD    Scheduled Meds: . [MAR Hold] famotidine (PEPCID) IV  20 mg Intravenous Q12H  . [MAR Hold] folic acid  1 mg Oral Daily  . [MAR Hold] lisinopril  20 mg Oral Daily   Continuous Infusions: . sodium chloride    . 0.9 % NaCl with KCl 20 mEq / L 75 mL/hr at 12/24/15 0810   PRN Meds:.[MAR Hold] acetaminophen, [MAR Hold] ondansetron **OR** [MAR Hold] ondansetron (ZOFRAN) IV, [MAR Hold] senna-docusate  Allergies as of 12/23/2015 - Review Complete 12/23/2015  Allergen Reaction Noted  . Bee venom Anaphylaxis 01/31/2014    Family History  Problem Relation Age of Onset  . Family history unknown: Yes    Social History   Social History  . Marital Status: Married    Spouse Name: N/A  . Number of Children: N/A  . Years of Education: N/A   Occupational History  . Not on file.   Social History Main Topics  . Smoking status: Former Smoker -- 1.00 packs/day for 35 years    Types: Cigarettes    Quit date: 10/30/2015  . Smokeless tobacco: Never Used  . Alcohol Use: 7.2 oz/week    12 Cans of beer per week     Comment: 2-6 cans beer daily,  last drink yesterday  . Drug Use: Yes    Special: Cocaine  . Sexual Activity: Yes   Other Topics Concern  . Not on file   Social History Narrative   ** Merged History Encounter **        Review of Systems: All negative except as stated above in HPI.  Physical Exam: Vital signs: Filed Vitals:   12/24/15 0918 12/24/15 1004  BP: 139/50 151/46  Pulse: 85 88  Temp: 98.3 F (36.8 C) 98.6 F (37 C)  Resp: 18 14   Last BM Date: 12/24/15 General:   Alert, thin, pleasant and cooperative in NAD Head: atraumatic Eyes: anicteric sclera ENT: oropharynx clear Neck: large fullness bilaterally of neck,  nontender Lungs:  Clear throughout to auscultation.   No wheezes, crackles, or rhonchi. No acute distress. Heart:  Regular rate and rhythm; no murmurs, clicks, rubs,  or gallops. Abdomen: diffuse tenderness with guarding (greatest in periumbilical area), soft, nondistended, +BS  Rectal:  Deferred Ext: no edema  GI:  Lab Results:  Recent Labs  12/23/15 1142 12/24/15 0058  WBC 18.0* 14.9*  HGB 5.7* 7.3*  HCT 18.7* 22.1*  PLT 764* 598*   BMET  Recent Labs  12/23/15 1142 12/24/15 0058  NA 126* 126*  K 4.2 3.9  CL  --  96*  CO2 23 23  GLUCOSE 117 99  BUN 9.9 11  CREATININE 0.6* 0.38*  CALCIUM 8.5 7.7*   LFT  Recent Labs  12/23/15 1142  PROT 5.8*  ALBUMIN 2.3*  AST 17  ALT 9  ALKPHOS 136  BILITOT <0.30   PT/INR No results for input(s): LABPROT, INR in the last 72 hours.   Studies/Results: No results found.  Impression/Plan: 60 yo with severe anemia and recent melena concerning for a peptic ulcer bleed. NPO for EGD today. May need Protonix drip if complicated ulcer noted but will await EGD findings. Supportive care.     LOS: 1 day   [place] C.  12/24/2015, 11:36 AM  Pager [phone]  If no answer or after 5 PM call [phone]

## 2015-12-24 NOTE — Care Management Note (Signed)
Case Management Note  Patient Details  Name: David Warren MRN: [id number] Date of Birth: Apr 24, 1956  Subjective/Objective: Patient/family in rm(daughters speak english) per patient request used-daughter to interpret. Patient has a pcp-Dr. Talmadge Coventry is safe, no assistive device used @ home. No CM needs.                  Action/Plan:d/c plan home.   Expected Discharge Date:   (UNKNOWN)               Expected Discharge Plan:  Home/Self Care  In-House Referral:  Interpreting Services  Discharge planning Services  CM Consult  Post Acute Care Choice:    Choice offered to:     DME Arranged:    DME Agency:     HH Arranged:    HH Agency:     Status of Service:  In process, will continue to follow  If discussed at Long Length of Stay Meetings, dates discussed:    Additional Comments:  Dessa Phi, RN 12/24/2015, 2:51 PM

## 2015-12-24 NOTE — Op Note (Signed)
[facility] Patient Name: David Warren Procedure Date: 12/24/2015 MRN: [id number] Attending MD: Lear Ng , MD Date of Birth: 01/23/1956 CSN: [id number] Age: 60 Admit Type: Inpatient Procedure:                Upper GI endoscopy Indications:              Melena Providers:                Lear Ng, MD, Hilma Favors, RN, Alfonso Patten, Technician, Rosario Adie, CRNA Referring MD:              Medicines:                Propofol per Anesthesia, Monitored Anesthesia Care Complications:            No immediate complications. Estimated Blood Loss:     Estimated blood loss: none. Procedure:                Pre-Anesthesia Assessment:                           - Prior to the procedure, a History and Physical                            was performed, and patient medications and                            allergies were reviewed. The patient's tolerance of                            previous anesthesia was also reviewed. The risks                            and benefits of the procedure and the sedation                            options and risks were discussed with the patient.                            All questions were answered, and informed consent                            was obtained. Prior Anticoagulants: The patient has                            taken no previous anticoagulant or antiplatelet                            agents. ASA Grade Assessment: III - A patient with                            severe systemic disease. After reviewing the risks  and benefits, the patient was deemed in                            satisfactory condition to undergo the procedure.                           After obtaining informed consent, the endoscope was                            passed under direct vision. Throughout the                            procedure, the patient's blood pressure, pulse, and                  oxygen saturations were monitored continuously. The                            [id number] [phone]) scope was introduced through the                            mouth, and advanced to the second part of duodenum.                            The upper GI endoscopy was accomplished without                            difficulty. The patient tolerated the procedure                            well. Scope In: Scope Out: Findings:      The examined esophagus was normal.      The Z-line was regular and was found 42 cm from the incisors.      One non-bleeding superficial gastric ulcer with pigmented material was       found in the gastric body. The lesion was 5 mm in largest dimension.      The exam of the stomach was otherwise normal.      The examined duodenum was normal. Impression:               - Normal esophagus.                           - Z-line regular, 42 cm from the incisors.                           - Non-bleeding gastric ulcer with pigmented                            material.                           - Normal examined duodenum.                           - No specimens collected. Moderate Sedation:      N/A- Per Anesthesia Care Recommendation:           -  Give Protonix (pantoprazole): initiate therapy                            with 80 mg IV bolus, then 8 mg/hr IV by continuous                            infusion.                           - Clear liquid diet.                           - Post procedure medication orders were given. Procedure Code(s):        --- Professional ---                           [phone], Esophagogastroduodenoscopy, flexible,                            transoral; diagnostic, including collection of                            specimen(s) by brushing or washing, when performed                            (separate procedure) Diagnosis Code(s):        --- Professional ---                           K92.1, Melena (includes Hematochezia)                            K25.9, Gastric ulcer, unspecified as acute or                            chronic, without hemorrhage or perforation CPT copyright 2016 American Medical Association. All rights reserved. The codes documented in this report are preliminary and upon coder review may  be revised to meet current compliance requirements. Lear Ng, MD 12/24/2015 12:12:16 PM This report has been signed electronically. Number of Addenda: 0

## 2015-12-24 NOTE — Care Management Note (Signed)
Case Management Note  Patient Details  Name: David Warren MRN: [id number] Date of Birth: 10-22-1955  Subjective/Objective:Spanish speaking 60 y/o m admitted  W/melena. From home.Will use interpreting service.                  Action/Plan:d/c plan home.   Expected Discharge Date:   (UNKNOWN)               Expected Discharge Plan:  Home/Self Care  In-House Referral:  Interpreting Services  Discharge planning Services  CM Consult  Post Acute Care Choice:    Choice offered to:     DME Arranged:    DME Agency:     HH Arranged:    HH Agency:     Status of Service:  In process, will continue to follow  If discussed at Long Length of Stay Meetings, dates discussed:    Additional Comments:  Dessa Phi, RN 12/24/2015, 1:58 PM

## 2015-12-24 NOTE — Anesthesia Preprocedure Evaluation (Addendum)
Anesthesia Evaluation  Patient identified by MRN, date of birth, ID band Patient awake    Reviewed: Allergy & Precautions, NPO status , Patient's Chart, lab work & pertinent test results  Airway Mallampati: III       Dental  (+) Teeth Intact, Dental Advisory Given   Pulmonary COPD, Current Smoker, former smoker,    Pulmonary exam normal  (-) decreased breath sounds      Cardiovascular Exercise Tolerance: Good hypertension, Pt. on medications + CAD   Rhythm:Regular     Neuro/Psych Seizures -,     GI/Hepatic negative GI ROS, (+)     substance abuse  alcohol use,   Endo/Other  negative endocrine ROS  Renal/GU negative Renal ROS     Musculoskeletal   Abdominal   Peds  Hematology negative hematology ROS (+)   Anesthesia Other Findings   Reproductive/Obstetrics                           Anesthesia Physical  Anesthesia Plan  ASA: III  Anesthesia Plan: MAC   Post-op Pain Management:    Induction: Intravenous  Airway Management Planned:   Additional Equipment:   Intra-op Plan:   Post-operative Plan:   Informed Consent: I have reviewed the patients History and Physical, chart, labs and discussed the procedure including the risks, benefits and alternatives for the proposed anesthesia with the patient or authorized representative who has indicated his/her understanding and acceptance.   Dental advisory given  Plan Discussed with: CRNA and Anesthesiologist  Anesthesia Plan Comments:        Anesthesia Quick Evaluation

## 2015-12-24 NOTE — Interval H&P Note (Signed)
History and Physical Interval Note:  12/24/2015 11:50 AM  David Warren  has presented today for surgery, with the diagnosis of Melena  The various methods of treatment have been discussed with the patient and family. After consideration of risks, benefits and other options for treatment, the patient has consented to  Procedure(s): ESOPHAGOGASTRODUODENOSCOPY (EGD) WITH PROPOFOL (N/A) as a surgical intervention .  The patient's history has been reviewed, patient examined, no change in status, stable for surgery.  I have reviewed the patient's chart and labs.  Questions were answered to the patient's satisfaction.     [place] C.

## 2015-12-24 NOTE — Transfer of Care (Signed)
Immediate Anesthesia Transfer of Care Note  Patient: David Warren  Procedure(s) Performed: Procedure(s): ESOPHAGOGASTRODUODENOSCOPY (EGD) WITH PROPOFOL (N/A)  Patient Location: PACU  Anesthesia Type:MAC  Level of Consciousness: awake, alert  and oriented  Airway & Oxygen Therapy: Patient Spontanous Breathing and Patient connected to nasal cannula oxygen  Post-op Assessment: Report given to RN and Post -op Vital signs reviewed and stable  Post vital signs: Reviewed and stable  Last Vitals:  Filed Vitals:   12/24/15 0918 12/24/15 1004  BP: 139/50 151/46  Pulse: 85 88  Temp: 36.8 C 37 C  Resp: 18 14    Last Pain:  Filed Vitals:   12/24/15 1007  PainSc: 4          Complications: No apparent anesthesia complications

## 2015-12-24 NOTE — Brief Op Note (Signed)
Small gastric ulcer with bleeding stigmata. Changed to Protonix drip. Clear liquid diet. Follow H/Hs.

## 2015-12-25 ENCOUNTER — Inpatient Hospital Stay (HOSPITAL_COMMUNITY): Payer: Self-pay

## 2015-12-25 ENCOUNTER — Encounter (HOSPITAL_COMMUNITY): Payer: Self-pay | Admitting: Gastroenterology

## 2015-12-25 LAB — CBC
HCT: 19 % — ABNORMAL LOW (ref 39.0–52.0)
Hemoglobin: 6.1 g/dL — CL (ref 13.0–17.0)
MCH: 27.4 pg (ref 26.0–34.0)
MCHC: 32.1 g/dL (ref 30.0–36.0)
MCV: 85.2 fL (ref 78.0–100.0)
PLATELETS: 505 10*3/uL — AB (ref 150–400)
RBC: 2.23 MIL/uL — ABNORMAL LOW (ref 4.22–5.81)
RDW: 16.1 % — AB (ref 11.5–15.5)
WBC: 14.1 10*3/uL — AB (ref 4.0–10.5)

## 2015-12-25 LAB — BASIC METABOLIC PANEL
Anion gap: 4 — ABNORMAL LOW (ref 5–15)
BUN: 10 mg/dL (ref 6–20)
CALCIUM: 7.6 mg/dL — AB (ref 8.9–10.3)
CO2: 21 mmol/L — AB (ref 22–32)
CREATININE: 0.45 mg/dL — AB (ref 0.61–1.24)
Chloride: 105 mmol/L (ref 101–111)
Glucose, Bld: 108 mg/dL — ABNORMAL HIGH (ref 65–99)
Potassium: 4.3 mmol/L (ref 3.5–5.1)
SODIUM: 130 mmol/L — AB (ref 135–145)

## 2015-12-25 LAB — HEMOGLOBIN AND HEMATOCRIT, BLOOD
HCT: 21.6 % — ABNORMAL LOW (ref 39.0–52.0)
Hemoglobin: 7.1 g/dL — ABNORMAL LOW (ref 13.0–17.0)

## 2015-12-25 LAB — H. PYLORI ANTIBODY, IGG: H PYLORI IGG: 1.5 U/mL — AB (ref 0.0–0.8)

## 2015-12-25 LAB — PREPARE RBC (CROSSMATCH)

## 2015-12-25 MED ORDER — IOPAMIDOL (ISOVUE-300) INJECTION 61%
100.0000 mL | Freq: Once | INTRAVENOUS | Status: AC | PRN
  Administered 2015-12-25: 100 mL via INTRAVENOUS

## 2015-12-25 MED ORDER — DIATRIZOATE MEGLUMINE & SODIUM 66-10 % PO SOLN: 15.0000 mL | ORAL | Status: DC | PRN

## 2015-12-25 MED ORDER — SODIUM CHLORIDE 0.9 % IV SOLN: Freq: Once | INTRAVENOUS | Status: DC

## 2015-12-25 NOTE — Progress Notes (Signed)
CRITICAL VALUE ALERT  Critical value received:  Hgb 6.1  Date of notification:  12/25/15  Time of notification:  04:30  Critical value read back:Yes.    Nurse who received alert:  Ruben Im, RN  MD notified (1st page):  K Schoor  Time of first page:  04:48  MD notified (2nd page):  Time of second page:  Responding MD:  Jennet Maduro  Time MD responded:

## 2015-12-25 NOTE — Progress Notes (Signed)
PROGRESS NOTE  David Warren TDD:[id number] DOB: 12-12-1955 DOA: 12/23/2015 PCP: Maren Reamer, MD  HPI/Recap of past 24 hours: 60 y.o. male with medical history significant for recently diagnosed stage 3 lung adenocarcinoma who was seen in Oncology clinic 6/28 to initiate chemotherapy and was found to be anemic. Tolerated 2u pRBC on admission with good response, feeling well. EGD 6/29 with small recently bleeding gastric ulcer, required 1 unit of blood transfused this morning as well. This AM notes he had a melanotic stool overnight, currently denies pain or nausea.  Assessment/Plan: . Adenocarcinoma of left lung, stage 4 ([facility]) - under the care of Dr. Earlie Server, - chemo on hold for now until anemia is treated . Upper GI bleed - seen on EGD by Dr. Michail Sermon 6/29 - clear liquid diet - Protonix gtt started 6/29 - transfuse to keep Hb >7, check Hb this PM . Acute blood loss anemia Hyponatremia - not symptomatic, but held HCTZ, increase IVF and added fluid restriction. Na up to 130 today. Principal Problem:  Iron deficiency anemia Active Problems:  Adenocarcinoma of left lung, stage 4 ([facility])  Acute blood loss anemia  Code Status: FULL   Family Communication: Discussed with daughter and wife in addition to patient.   Disposition Plan: Likely to home in 2-3 days pending bleed.  Consultants:  GI Dr. Michail Sermon   Procedures:  EGD 6/29   Antimicrobials:  None    Objective: Filed Vitals:   12/24/15 1230 12/24/15 1244 12/24/15 2124 12/25/15 0625  BP: 140/42 136/46 139/51 145/51  Pulse: 83 81 88 96  Temp:  98.1 F (36.7 C) 99.5 F (37.5 C) 98.3 F (36.8 C)  TempSrc:  Oral Oral Oral  Resp: '19 18 18 18  '$ Height:      Weight:      SpO2: 100% 100% 100% 100%    Intake/Output Summary (Last 24 hours) at 12/25/15 0827 Last data filed at 12/25/15 0700  Gross per 24 hour  Intake 1909.58 ml  Output      0 ml  Net 1909.58 ml   Filed Weights   12/23/15 1331 12/24/15 1004    Weight: 53.978 kg (119 lb) 53.978 kg (119 lb)    Exam:  General: Alert, oriented, calm, in no acute distress   Eyes: pupils round and reactive to light and accomodation, clear sclerea  Neck: supple, he has large lipoma on posterior neck soft, nontender, trachea mildline   Cardiovascular: RRR, no murmurs or rubs, no peripheral edema   Respiratory: clear to auscultation bilaterally, no wheezes, no crackles   Abdomen: soft, nontender, nondistended, normal bowel tones heard   Skin: dry, no rashes   Musculoskeletal: no joint effusions, normal range of motion   Psychiatric: appropriate affect, normal speech   Neurologic: extraocular muscles intact, clear speech, moving all extremities with intact sensorium   Data Reviewed: CBC:  Recent Labs Lab 12/23/15 1142 12/24/15 0058 12/25/15 0417  WBC 18.0* 14.9* 14.1*  NEUTROABS 16.0*  --   --   HGB 5.7* 7.3* 6.1*  HCT 18.7* 22.1* 19.0*  MCV 85.5 83.7 85.2  PLT 764* 598* 623*   Basic Metabolic Panel:  Recent Labs Lab 12/23/15 1142 12/24/15 0058 12/25/15 0417  NA 126* 126* 130*  K 4.2 3.9 4.3  CL  --  96* 105  CO2 23 23 21*  GLUCOSE 117 99 108*  BUN 9.'9 11 10  '$ CREATININE 0.6* 0.38* 0.45*  CALCIUM 8.5 7.7* 7.6*   GFR: Estimated Creatinine Clearance: 74.1 mL/min (by  C-G formula based on Cr of 0.45). Liver Function Tests:  Recent Labs Lab 12/23/15 1142  AST 17  ALT 9  ALKPHOS 136  BILITOT <0.30  PROT 5.8*  ALBUMIN 2.3*   No results for input(s): LIPASE, AMYLASE in the last 168 hours. No results for input(s): AMMONIA in the last 168 hours. Coagulation Profile: No results for input(s): INR, PROTIME in the last 168 hours. Cardiac Enzymes: No results for input(s): CKTOTAL, CKMB, CKMBINDEX, TROPONINI in the last 168 hours. BNP (last 3 results) No results for input(s): PROBNP in the last 8760 hours. HbA1C: No results for input(s): HGBA1C in the last 72 hours. CBG: No results for input(s): GLUCAP in  the last 168 hours. Lipid Profile: No results for input(s): CHOL, HDL, LDLCALC, TRIG, CHOLHDL, LDLDIRECT in the last 72 hours. Thyroid Function Tests:  Recent Labs  12/23/15 1142  TSH 0.781   Anemia Panel:  Recent Labs  12/23/15 1421  VITAMINB12 397  FOLATE 8.0  FERRITIN 11*  TIBC 395  IRON 9*  RETICCTPCT 6.8*   Urine analysis: No results found for: COLORURINE, APPEARANCEUR, LABSPEC, PHURINE, GLUCOSEU, HGBUR, BILIRUBINUR, KETONESUR, PROTEINUR, UROBILINOGEN, NITRITE, LEUKOCYTESUR Sepsis Labs: '@LABRCNTIP'$ (procalcitonin:4,lacticidven:4)  )No results found for this or any previous visit (from the past 240 hour(s)).    Studies: No results found.  Scheduled Meds: . sodium chloride   Intravenous Once  . folic acid  1 mg Oral Daily  . lisinopril  20 mg Oral Daily  . [START ON 12/28/2015] pantoprazole  40 mg Intravenous Q12H    Continuous Infusions: . 0.9 % NaCl with KCl 20 mEq / L 75 mL/hr at 12/25/15 0022  . pantoprozole (PROTONIX) infusion 8 mg/hr (12/25/15 0022)     LOS: 2 days   Time spent: 28 minutes  Mir Marry Guan, MD [facility] Pager [phone]  If 7PM-7AM, please contact night-coverage [email or web address] Password [facility] 12/25/2015, 8:27 AM

## 2015-12-25 NOTE — Progress Notes (Signed)
Patient ID: Nasiah Lehenbauer, male   DOB: 1955/09/26, 60 y.o.   MRN: [id number] [facility] Progress Note  Izack Hoogland 60 y.o. 04/11/1956   Subjective: Minimal lower abdominal pain. Report of melena overnight (no chart documentation of any stools). Hgb 6.1 (7.3). Daughter at bedside.  Objective: Vital signs in last 24 hours: Filed Vitals:   12/24/15 2124 12/25/15 0625  BP: 139/51 145/51  Pulse: 88 96  Temp: 99.5 F (37.5 C) 98.3 F (36.8 C)  Resp: 18 18    Physical Exam: Gen: alert, no acute distress CV: RRR Chest: CTA B Abd: minimal lower abdominal tenderness without guarding, soft, nondistended, +BS  Lab Results:  Recent Labs  12/24/15 0058 12/25/15 0417  NA 126* 130*  K 3.9 4.3  CL 96* 105  CO2 23 21*  GLUCOSE 99 108*  BUN 11 10  CREATININE 0.38* 0.45*  CALCIUM 7.7* 7.6*    Recent Labs  12/23/15 1142  AST 17  ALT 9  ALKPHOS 136  BILITOT <0.30  PROT 5.8*  ALBUMIN 2.3*    Recent Labs  12/23/15 1142 12/24/15 0058 12/25/15 0417  WBC 18.0* 14.9* 14.1*  NEUTROABS 16.0*  --   --   HGB 5.7* 7.3* 6.1*  HCT 18.7* 22.1* 19.0*  MCV 85.5 83.7 85.2  PLT 764* 598* 505*   No results for input(s): LABPROT, INR in the last 72 hours.    Assessment/Plan: GI bleed - small gastric ulcer seen with bleeding stigmata. Hgb drop to 6.1 with family report of melenic stool overnight but no confirmation by staff. Hemodynamically stable but due to Hgb drop will do abd/pelvis CT with contrast to look for mets to abd with recent diagnosis of lung cancer to see if another source of bleeding. Also, evaluate for retroperitoneal bleed, which is less likely. May need a colonoscopy if anemia continues to worsen and no source seen on CT. Keep on clears until after CT done. Agree with blood transfusion.   [place] C. 12/25/2015, 9:42 AM  Pager [phone]  If no answer or after 5 PM call [phone]

## 2015-12-26 ENCOUNTER — Other Ambulatory Visit: Payer: Self-pay | Admitting: Hematology

## 2015-12-26 DIAGNOSIS — K922 Gastrointestinal hemorrhage, unspecified: Secondary | ICD-10-CM

## 2015-12-26 DIAGNOSIS — D62 Acute posthemorrhagic anemia: Secondary | ICD-10-CM

## 2015-12-26 DIAGNOSIS — C7889 Secondary malignant neoplasm of other digestive organs: Secondary | ICD-10-CM

## 2015-12-26 DIAGNOSIS — C3492 Malignant neoplasm of unspecified part of left bronchus or lung: Secondary | ICD-10-CM

## 2015-12-26 DIAGNOSIS — C3490 Malignant neoplasm of unspecified part of unspecified bronchus or lung: Secondary | ICD-10-CM

## 2015-12-26 DIAGNOSIS — D509 Iron deficiency anemia, unspecified: Secondary | ICD-10-CM

## 2015-12-26 LAB — BASIC METABOLIC PANEL
Anion gap: 6 (ref 5–15)
BUN: 13 mg/dL (ref 6–20)
CHLORIDE: 106 mmol/L (ref 101–111)
CO2: 20 mmol/L — AB (ref 22–32)
CREATININE: 0.46 mg/dL — AB (ref 0.61–1.24)
Calcium: 7.5 mg/dL — ABNORMAL LOW (ref 8.9–10.3)
GFR calc non Af Amer: >60 mL/min (ref >60)
Glucose, Bld: 110 mg/dL — ABNORMAL HIGH (ref 65–99)
POTASSIUM: 4 mmol/L (ref 3.5–5.1)
Sodium: 132 mmol/L — ABNORMAL LOW (ref 135–145)

## 2015-12-26 LAB — CBC
HEMATOCRIT: 17.6 % — AB (ref 39.0–52.0)
HEMOGLOBIN: 5.8 g/dL — AB (ref 13.0–17.0)
MCH: 28.3 pg (ref 26.0–34.0)
MCHC: 33 g/dL (ref 30.0–36.0)
MCV: 85.9 fL (ref 78.0–100.0)
Platelets: 442 10*3/uL — ABNORMAL HIGH (ref 150–400)
RBC: 2.05 MIL/uL — ABNORMAL LOW (ref 4.22–5.81)
RDW: 15.8 % — ABNORMAL HIGH (ref 11.5–15.5)
WBC: 12.4 10*3/uL — ABNORMAL HIGH (ref 4.0–10.5)

## 2015-12-26 LAB — HEMOGLOBIN AND HEMATOCRIT, BLOOD
HEMATOCRIT: 23.6 % — AB (ref 39.0–52.0)
Hemoglobin: 7.8 g/dL — ABNORMAL LOW (ref 13.0–17.0)

## 2015-12-26 LAB — PREPARE RBC (CROSSMATCH)

## 2015-12-26 MED ORDER — SODIUM CHLORIDE 0.9 % IV SOLN
510.0000 mg | Freq: Once | INTRAVENOUS | Status: AC
  Administered 2015-12-26: 510 mg via INTRAVENOUS
  Filled 2015-12-26: qty 17

## 2015-12-26 MED ORDER — SODIUM CHLORIDE 0.9 % IV SOLN
Freq: Once | INTRAVENOUS | Status: AC
  Administered 2015-12-26: 10:00:00 via INTRAVENOUS

## 2015-12-26 MED ORDER — SODIUM CHLORIDE 0.9 % IV SOLN: 100.0000 mg | Freq: Once | INTRAVENOUS | Status: DC

## 2015-12-26 NOTE — Progress Notes (Signed)
Patient ID: Harless Molinari, male   DOB: 1956-02-24, 60 y.o.   MRN: [id number] [facility] Gastroenterology Progress Note  Aloysious Vangieson 60 y.o. 31-Aug-1955   Subjective: Multiple black stools overnight. Hgb 5.8. Denies abdominal pain. Daughter and wife at bedside.  Objective: Vital signs in last 24 hours: Filed Vitals:   12/26/15 0913 12/26/15 0935  BP: 150/50 138/52  Pulse: 98 93  Temp: 98.5 F (36.9 C) 99.3 F (37.4 C)  Resp: 16 18    Physical Exam: Gen: alert, no acute distress CV: RRR Chest: CTA B Abd: minimal epigastric and RUQ tenderness without guarding, soft, nondistended, +BS Ext: no edema  Lab Results:  Recent Labs  12/25/15 0417 12/26/15 0521  NA 130* 132*  K 4.3 4.0  CL 105 106  CO2 21* 20*  GLUCOSE 108* 110*  BUN 10 13  CREATININE 0.45* 0.46*  CALCIUM 7.6* 7.5*    Recent Labs  12/23/15 1142  AST 17  ALT 9  ALKPHOS 136  BILITOT <0.30  PROT 5.8*  ALBUMIN 2.3*    Recent Labs  12/23/15 1142  12/25/15 0417 12/25/15 1508 12/26/15 0521  WBC 18.0*  < > 14.1*  --  12.4*  NEUTROABS 16.0*  --   --   --   --   HGB 5.7*  < > 6.1* 7.1* 5.8*  HCT 18.7*  < > 19.0* 21.6* 17.6*  MCV 85.5  < > 85.2  --  85.9  PLT 764*  < > 505*  --  442*  < > = values in this interval not displayed. No results for input(s): LABPROT, INR in the last 72 hours.  Assessment/Plan: GI Bleed with a CT concerning for metastatic cancer to the small bowel and proximal colon (proximal transverse colon) with concern for hemorrhage from the colon on CT. Mets to small bowel (distal duodenum) also could be source of bleeding. If he is having bleeding from mets, then no therapeutic option to achieve hemostasis with colonoscopy or enteroscopy. If a diagnostic procedure would change the management plan of oncology, then will proceed with the procedures and will need to give a colon prep today BUT if no change in oncology's plans or if role for radiation to slow down and stop this bleeding then would  hold off on doing these endoscopic procedures. I expressed to the daughter that I cannot stop bleeding with a colonoscope if a metastasis is the source, which is what the CT is suggesting. Highly likely he has more than one met bleeding especially with the numerous small bowel lesions and black tarry stools. You can have black stools from a colonic source but I am more worried that his small bowel lesions are the source. Agree with blood transfusions. Answered all the patient's and daughter's questions. Keep on clear liquid diet only. I called Dr. Burr Medico to have her f/u on pt today to provide oncologic guidance and she will see him today since she is covering for Dr. Julien Nordmann. GI will be on standby to prep for colonoscopy if oncology feels that it will be helpful to his overall treatment plan.   [place] C. 12/26/2015, 10:18 AM  Pager [phone]  If no answer or after 5 PM call [phone]

## 2015-12-26 NOTE — Progress Notes (Signed)
CRITICAL VALUE ALERT  Critical value received:5.8 Hgb  Date of notification:  12/26/15  Time of notification:  0555  Critical value read back:Yes.    Nurse who received alert:  Thana Farr  MD notified (1st page):  Donnal Debar  Time of first page:  0600  MD notified (2nd page):  Time of second page:  Responding MD: Donnal Debar  Time MD responded:  1030   On call ordered 2 unit of blood

## 2015-12-26 NOTE — Progress Notes (Signed)
Patient ID: David Warren, male   DOB: May 27, 1956, 60 y.o.   MRN: [id number]  Called by oncology and discussed options for further work up of his GI bleeding and concern for small bowel mets. Will do a capsule endoscopy tomorrow morning to see if any diffuse areas mets with small bowel bleeding vs. focal area of mets with small bowel bleeding. NPO p MN for planned capsule endoscopy tomorrow. Continue blood transfusions as needed.

## 2015-12-26 NOTE — Progress Notes (Signed)
PROGRESS NOTE  David Warren JXB:[id number] DOB: 1955/10/12 DOA: 12/23/2015 PCP: Maren Reamer, MD  HPI/Recap of past 31 hours: 60 year old male past history of recently diagnosed stage III adenocarcinoma of the lung initially followed up with oncology to start chemotherapy on 6/28 and that time found to be significantly anemic. Patient admitted to hospitalist service and received 2 units packed red blood cells. EGD done 6/29 by gastroneurology noted small recently bleeding gastric ulcer, however patient has continued to bleed requiring another unit of blood on 6/30 and then 2 more units on 7/1. Oncology followed up after discussion with gastroenterology plan for capsule endoscopy on 7/2.  Patient himself is not speaking with patient through translator, complains only minimal abdominal discomfort but no other symptoms. No dizziness or trouble breathing.  Assessment/Plan: Principal Problem:  Acute blood loss anemia with underlying Iron deficiency anemia: Concerns for suspected metastases: Status post multiple transfusions. For capsule Endoscopy in the Morning. We'll order IV iron 1 Active Problems:   Adenocarcinoma of left lung, stage 4 [facility]): Outpatient chemotherapy to be initiated once stabilized, although oncology comments that if his small bowel metastases are causing this bleeding there are not really good treatment options. If he has localized bowel metastases possible surgical resection may help.   Acute blood loss anemia   Melena   Code Status: Full code   Family Communication: Multiple family members present   Disposition Plan: Depending on outcome of capsule endoscopy    Consultants:  GI  Gastroenterology   Procedures:  EGD done 6/29: Noted recently bleeding gastric ulcer 1  Blood unit transfusions done 6/29, 6/30 and 7/1   Antimicrobials:  None   DVT prophylaxis:  \SCD   Objective: Filed Vitals:   12/26/15 1153 12/26/15 1240 12/26/15 1257 12/26/15 1440    BP: 152/54 145/54 142/57 161/55  Pulse: 91 89 92 94  Temp: 99.5 F (37.5 C) 100 F (37.8 C) 99.3 F (37.4 C) 99 F (37.2 C)  TempSrc: Oral Oral Oral Oral  Resp: 18     Height:      Weight:      SpO2: 100% 100% 100% 100%    Intake/Output Summary (Last 24 hours) at 12/26/15 1711 Last data filed at 12/26/15 1439  Gross per 24 hour  Intake   1670 ml  Output      0 ml  Net   1670 ml   Filed Weights   12/23/15 1331 12/24/15 1004  Weight: 53.978 kg (119 lb) 53.978 kg (119 lb)    Exam:   General:  Alert and oriented 3, no acute distress   Cardiovascular: Regular rate and rhythm, S1-S2   Respiratory: Clear to auscultation bilaterally   Abdomen: Soft, nontender, nondistended, hypoactive bowel sounds   Musculoskeletal: No clubbing or cyanosis or edema   Skin: Large lipoma on neck  Psychiatry: Patient is appropriate, no evidence of psychoses    Data Reviewed: CBC:  Recent Labs Lab 12/23/15 1142 12/24/15 0058 12/25/15 0417 12/25/15 1508 12/26/15 0521  WBC 18.0* 14.9* 14.1*  --  12.4*  NEUTROABS 16.0*  --   --   --   --   HGB 5.7* 7.3* 6.1* 7.1* 5.8*  HCT 18.7* 22.1* 19.0* 21.6* 17.6*  MCV 85.5 83.7 85.2  --  85.9  PLT 764* 598* 505*  --  130*   Basic Metabolic Panel:  Recent Labs Lab 12/23/15 1142 12/24/15 0058 12/25/15 0417 12/26/15 0521  NA 126* 126* 130* 132*  K 4.2 3.9 4.3 4.0  CL  --  96* 105 106  CO2 23 23 21* 20*  GLUCOSE 117 99 108* 110*  BUN 9.'9 11 10 13  '$ CREATININE 0.6* 0.38* 0.45* 0.46*  CALCIUM 8.5 7.7* 7.6* 7.5*   GFR: Estimated Creatinine Clearance: 74.1 mL/min (by C-G formula based on Cr of 0.46). Liver Function Tests:  Recent Labs Lab 12/23/15 1142  AST 17  ALT 9  ALKPHOS 136  BILITOT <0.30  PROT 5.8*  ALBUMIN 2.3*   No results for input(s): LIPASE, AMYLASE in the last 168 hours. No results for input(s): AMMONIA in the last 168 hours. Coagulation Profile: No results for input(s): INR, PROTIME in the last 168  hours. Cardiac Enzymes: No results for input(s): CKTOTAL, CKMB, CKMBINDEX, TROPONINI in the last 168 hours. BNP (last 3 results) No results for input(s): PROBNP in the last 8760 hours. HbA1C: No results for input(s): HGBA1C in the last 72 hours. CBG: No results for input(s): GLUCAP in the last 168 hours. Lipid Profile: No results for input(s): CHOL, HDL, LDLCALC, TRIG, CHOLHDL, LDLDIRECT in the last 72 hours. Thyroid Function Tests: No results for input(s): TSH, T4TOTAL, FREET4, T3FREE, THYROIDAB in the last 72 hours. Anemia Panel: No results for input(s): VITAMINB12, FOLATE, FERRITIN, TIBC, IRON, RETICCTPCT in the last 72 hours. Urine analysis: No results found for: COLORURINE, APPEARANCEUR, LABSPEC, PHURINE, GLUCOSEU, HGBUR, BILIRUBINUR, KETONESUR, PROTEINUR, UROBILINOGEN, NITRITE, LEUKOCYTESUR Sepsis Labs: '@LABRCNTIP'$ (procalcitonin:4,lacticidven:4)  )No results found for this or any previous visit (from the past 240 hour(s)).    Studies: No results found.  Scheduled Meds: . folic acid  1 mg Oral Daily  . lisinopril  20 mg Oral Daily  . [START ON 12/28/2015] pantoprazole  40 mg Intravenous Q12H    Continuous Infusions: . 0.9 % NaCl with KCl 20 mEq / L 75 mL/hr at 12/25/15 1524  . pantoprozole (PROTONIX) infusion 8 mg/hr (12/26/15 1156)     LOS: 3 days   Time spent: 15 minutes  Annita Brod, MD [facility] Pager [phone]  If 7PM-7AM, please contact night-coverage [email or web address] Password [email or web address] 12/26/2015, 5:11 PM

## 2015-12-26 NOTE — Progress Notes (Signed)
David Warren   DOB:August 20, 1955   GG#:[id number]   VOJ#:[id number]  ONCOLOGY FOLLOW UP   Subjective: I am covering for my partner Dr. Earlie Server to see the patient. He is a 60 year old Spanish-speaking gentleman, with very diagnosed metastatic lung cancer, was about to start first chemotherapy last week, he was admitted for severe anemia from GI bleeding. He received 2 units of RBC on June 28, 1 units RBCs yesterday, and Hb dropped to 5.8 this morning again and is receiving blood transfusion today. He continues to have melena 2-3 times a day, no bowel movement this morning, EGD was done 2 days ago which showed non-bleeding gastric ulcer.   Objective:  Filed Vitals:   12/26/15 1153 12/26/15 1240  BP: 152/54 145/54  Pulse: 91 89  Temp: 99.5 F (37.5 C) 100 F (37.8 C)  Resp: 18     Body mass index is 21.09 kg/(m^2).  Intake/Output Summary (Last 24 hours) at 12/26/15 1256 Last data filed at 12/26/15 1152  Gross per 24 hour  Intake   1865 ml  Output      0 ml  Net   1865 ml     Sclerae unicteric  Oropharynx clear  No peripheral adenopathy  Lungs clear -- no rales or rhonchi  Heart regular rate and rhythm  Abdomen benign  MSK no focal spinal tenderness, no peripheral edema  Neuro nonfocal    CBG (last 3)  No results for input(s): GLUCAP in the last 72 hours.   Labs:  CBC Latest Ref Rng 12/26/2015 12/25/2015 12/25/2015  WBC 4.0 - 10.5 K/uL 12.4(H) - 14.1(H)  Hemoglobin 13.0 - 17.0 g/dL 5.8(LL) 7.1(L) 6.1(LL)  Hematocrit 39.0 - 52.0 % 17.6(L) 21.6(L) 19.0(L)  Platelets 150 - 400 K/uL 442(H) - 505(H)    CMP Latest Ref Rng 12/26/2015 12/25/2015 12/24/2015  Glucose 65 - 99 mg/dL 110(H) 108(H) 99  BUN 6 - 20 mg/dL '13 10 11  '$ Creatinine 0.61 - 1.24 mg/dL 0.46(L) 0.45(L) 0.38(L)  Sodium 135 - 145 mmol/L 132(L) 130(L) 126(L)  Potassium 3.5 - 5.1 mmol/L 4.0 4.3 3.9  Chloride 101 - 111 mmol/L 106 105 96(L)  CO2 22 - 32 mmol/L 20(L) 21(L) 23  Calcium 8.9 - 10.3 mg/dL 7.5(L) 7.6(L) 7.7(L)   Total Protein 6.4 - 8.3 g/dL - - -  Total Bilirubin 0.20 - 1.20 mg/dL - - -  Alkaline Phos 40 - 150 U/L - - -  AST 5 - 34 U/L - - -  ALT 0 - 55 U/L - - -     Urine Studies No results for input(s): UHGB, CRYS in the last 72 hours.  Invalid input(s): UACOL, UAPR, USPG, UPH, UTP, UGL, UKET, UBIL, UNIT, UROB, ULEU, UEPI, UWBC, URBC, UBAC, CAST, [place], [place]  Basic Metabolic Panel:  Recent Labs Lab 12/23/15 1142  12/24/15 0058 12/25/15 0417 12/26/15 0521  NA 126*  --  126* 130* 132*  K 4.2  < > 3.9 4.3 4.0  CL  --   --  96* 105 106  CO2 23  --  23 21* 20*  GLUCOSE 117  --  99 108* 110*  BUN 9.9  --  '11 10 13  '$ CREATININE 0.6*  --  0.38* 0.45* 0.46*  CALCIUM 8.5  --  7.7* 7.6* 7.5*  < > = values in this interval not displayed. GFR Estimated Creatinine Clearance: 74.1 mL/min (by C-G formula based on Cr of 0.46). Liver Function Tests:  Recent Labs Lab 12/23/15 1142  AST 17  ALT 9  ALKPHOS 136  BILITOT <0.30  PROT 5.8*  ALBUMIN 2.3*   No results for input(s): LIPASE, AMYLASE in the last 168 hours. No results for input(s): AMMONIA in the last 168 hours. Coagulation profile No results for input(s): INR, PROTIME in the last 168 hours.  CBC:  Recent Labs Lab 12/23/15 1142 12/24/15 0058 12/25/15 0417 12/25/15 1508 12/26/15 0521  WBC 18.0* 14.9* 14.1*  --  12.4*  NEUTROABS 16.0*  --   --   --   --   HGB 5.7* 7.3* 6.1* 7.1* 5.8*  HCT 18.7* 22.1* 19.0* 21.6* 17.6*  MCV 85.5 83.7 85.2  --  85.9  PLT 764* 598* 505*  --  442*   Cardiac Enzymes: No results for input(s): CKTOTAL, CKMB, CKMBINDEX, TROPONINI in the last 168 hours. BNP: Invalid input(s): POCBNP CBG: No results for input(s): GLUCAP in the last 168 hours. D-Dimer No results for input(s): DDIMER in the last 72 hours. Hgb A1c No results for input(s): HGBA1C in the last 72 hours. Lipid Profile No results for input(s): CHOL, HDL, LDLCALC, TRIG, CHOLHDL, LDLDIRECT in the last 72 hours. Thyroid function  studies No results for input(s): TSH, T4TOTAL, T3FREE, THYROIDAB in the last 72 hours.  Invalid input(s): FREET3 Anemia work up  Recent Labs  12/23/15 1421  VITAMINB12 397  FOLATE 8.0  FERRITIN 11*  TIBC 395  IRON 9*  RETICCTPCT 6.8*   Microbiology No results found for this or any previous visit (from the past 240 hour(s)).    Studies:  Ct Abdomen Pelvis W Contrast  12/25/2015  CLINICAL DATA:  Mid to lower abdominal pain. History of lung cancer and GI bleed. Anemia. Blood in stools. EXAM: CT ABDOMEN AND PELVIS WITH CONTRAST TECHNIQUE: Multidetector CT imaging of the abdomen and pelvis was performed using the standard protocol following bolus administration of intravenous contrast. CONTRAST:  132m ISOVUE-300 IOPAMIDOL (ISOVUE-300) INJECTION 61% COMPARISON:  PET-CT 11/20/2015 FINDINGS: Lower chest: Stable 4 mm right middle lobe pulmonary nodule. No other pulmonary lesions. There are very small pleural effusions and overlying atelectasis. Hepatobiliary: No focal hepatic lesions to suggest metastatic disease. The gallbladder is partially contracted. A small gallstone is noted. No common bile duct dilatation. Pancreas: No mass, inflammation or ductal dilatation. Spleen: Linear defect in the upper aspect of the spleen is most likely a this is new since the prior PET-CT. Splenic infarct. Adrenals/Urinary Tract: Stable bilateral adrenal gland lesions. The left adrenal gland lesion is likely an adenoma. The right adrenal gland is likely of metastasis based on the prior PET-CT. 2.8 cm enhancing lower pole right renal mass consistent with renal cell carcinoma. The left kidney is normal. Stomach/Bowel: The stomach is unremarkable. No obvious mass or ulcer. The duodenum appears normal. There appear to be numerous soft tissue lesions involving the small bowel and colon suspicious for metastasis. In retrospect I think the patchy [place] also. There is an acute inflammatory process between the proximal  transverse colon and adjacent small bowel loop. This is most likely a a colonic process. It could be acute diverticulitis but more likely a metastatic lesion that bled. Vascular/Lymphatic: Advanced atherosclerotic calcifications involving the aorta. No aneurysm or dissection. The major venous structures are patent. No mesenteric or retroperitoneal mass or adenopathy. Other: Small amount of free pelvic fluid. The bladder, prostate gland and seminal vesicles appear normal. No inguinal mass or adenopathy. Musculoskeletal: No significant bony findings. IMPRESSION: 1. Acute inflammatory process centered around the inferior margin of the proximal transverse colon and an adjacent loop of  jejunum. Focal wall thickening of the colon is likely the epicenter and could be due to a metastasis that has hemorrhaged. Diverticulitis is felt to be unlikely. The adjacent small bilobed also appears normal and there could be a metastasis in this area also. Colonoscopy may be helpful for diagnostic purposes. Upper Endoscopy may also be helpful as there are lesions in the third portion the duodenum. 2. Diffuse small bowel lesions suspicious for diffuse metastasis. In retrospect I think these were positive on the PET scan. 3. 2.8 cm enhancing right renal mass most consistent with renal cell cancer. 4. Small bilateral adrenal gland lesions. The right lesion was hypermetabolic on the recent PET-CT. The left lesion is a benign adenoma. These results were called by telephone at the time of interpretation on 12/25/2015 at 3:51 pm to Dr. Wilford Corner , who verbally acknowledged these results. Electronically Signed   By: Marijo Sanes M.D.   On: 12/25/2015 15:51    Assessment: 60 y.o. with recently diagnosed stage IV lung cancer, first chemotherapy treatment was held last week due to severe anemia and patient was admitted for GI bleeding  1. GI bleeding, possible secondary to metastasis to small bowel  2. Metastatic lung adenocarcinoma  to lung, right adrenal gland, and bowel 3, anemia secondary to GI bleeding and iron deficiency 4. Malnutrition secondary to underlying malignancy 5. Thrombocytosis, likely reactive secondary to iron deficiency  Plan:  -I reviewed his chart, scan, and lab results, and discussed with patient -Given his young age, good performance status, untreated metastatic lung cancer, patient and his family would like to find the source of GI bleeding, to see if is anything treatable to stop the bleeding. I recommend RBC nuclear scan if he has more clinical significant bleeding, and or capsule endoscopy. I discussed with Dr. Michail Sermon today, and he agrees to consider capsule endoscopy, possible tomorrow. Further endoscopy can be considere  -I review based on the capsule endoscopy findings. -Patient's daughters had many questions regarding his prognosis and cancer treatment. Unfortunately we're not able to start chemotherapy at this point due to his significant GI bleeding. His tumor has not been tested for PDL 1 due to the lack of tissue , so first-line immunotherapy may not be approved by his insurance. If he has diffuse small bowel metastasis which causing the bleeding, then we really do not have a good treatment options. If he has localized bowel metastasis, I think it will be reasonable to consider surgical resection if the bleeding does not stop. Patient's family are aware of the incurable nature of his metastatic lung cancer. -please give IV feraheme '510mg'$  once in the hospital for his iron deficiency, which will help his anemia. -I will follow up tomorrow, and discuss with his primary Oncologist Dr. Julien Nordmann.  Truitt Merle, MD 12/26/2015  12:56 PM

## 2015-12-26 NOTE — Progress Notes (Signed)
Temp 100F at start of second unit of PRBC. MD made aware. MD gave orders to proceed with blood transfusion. Will continue to monitor.  David Warren [facility]

## 2015-12-27 ENCOUNTER — Encounter (HOSPITAL_COMMUNITY)
Admission: AD | Disposition: A | Discharge status: Hospice | Payer: Self-pay | Source: Ambulatory Visit | Attending: Internal Medicine

## 2015-12-27 HISTORY — PX: GIVENS CAPSULE STUDY: SHX5432

## 2015-12-27 LAB — BASIC METABOLIC PANEL
ANION GAP: 3 — AB (ref 5–15)
BUN: 14 mg/dL (ref 6–20)
CALCIUM: 7.6 mg/dL — AB (ref 8.9–10.3)
CO2: 20 mmol/L — ABNORMAL LOW (ref 22–32)
Chloride: 110 mmol/L (ref 101–111)
Creatinine, Ser: 0.33 mg/dL — ABNORMAL LOW (ref 0.61–1.24)
Glucose, Bld: 110 mg/dL — ABNORMAL HIGH (ref 65–99)
Potassium: 4 mmol/L (ref 3.5–5.1)
SODIUM: 133 mmol/L — AB (ref 135–145)

## 2015-12-27 LAB — CBC
HEMATOCRIT: 20.4 % — AB (ref 39.0–52.0)
Hemoglobin: 6.9 g/dL — CL (ref 13.0–17.0)
MCH: 29.4 pg (ref 26.0–34.0)
MCHC: 33.8 g/dL (ref 30.0–36.0)
MCV: 86.8 fL (ref 78.0–100.0)
PLATELETS: 386 10*3/uL (ref 150–400)
RBC: 2.35 MIL/uL — ABNORMAL LOW (ref 4.22–5.81)
RDW: 15.6 % — AB (ref 11.5–15.5)
WBC: 12 10*3/uL — AB (ref 4.0–10.5)

## 2015-12-27 LAB — PREPARE RBC (CROSSMATCH)

## 2015-12-27 SURGERY — IMAGING PROCEDURE, GI TRACT, INTRALUMINAL, VIA CAPSULE
Anesthesia: LOCAL

## 2015-12-27 MED ORDER — SODIUM CHLORIDE 0.9 % IV SOLN: Freq: Once | INTRAVENOUS | Status: DC

## 2015-12-27 SURGICAL SUPPLY — 1 items: TOWEL COTTON PACK 4EA (MISCELLANEOUS) ×4 IMPLANT

## 2015-12-27 NOTE — Progress Notes (Signed)
Temperature 99.4 after 2 unit transfused. Baseline temperature has been 98.7. MD notified. Orders to give tylenol if temperature rises greater than 100. Will continue to monitor.  Barbee Shropshire. Brigitte Pulse, RN

## 2015-12-27 NOTE — Progress Notes (Signed)
Assumed care of patient. Agree with previous shift assessment. Patient has no concerns/complaints at this time.  David Warren. Brigitte Pulse, RN

## 2015-12-27 NOTE — Progress Notes (Signed)
David Warren   DOB:Oct 19, 1955   QJ#:[id number]   KGY#:[id number]  ONCOLOGY FOLLOW UP   Subjective: David Warren had started capsule endoscopy this morning. He had 4-5 bowel movements with melanoma, small to moderate amount, a new complaints. His hemoglobin 6.9g/dl this morning, despite 2u RBC yesterday. No fever, VS stable.   Objective:  Filed Vitals:   12/27/15 1017 12/27/15 1046  BP: 139/60 153/56  Pulse: 90 90  Temp: 98.6 F (37 C) 98.6 F (37 C)  Resp: 18 18    Body mass index is 21.09 kg/(m^2).  Intake/Output Summary (Last 24 hours) at 12/27/15 1202 Last data filed at 12/26/15 2300  Gross per 24 hour  Intake   2682 ml  Output      0 ml  Net   2682 ml     Sclerae unicteric  Oropharynx clear  No peripheral adenopathy  Lungs clear -- no rales or rhonchi  Heart regular rate and rhythm  Abdomen benign  MSK no focal spinal tenderness, no peripheral edema  Neuro nonfocal    CBG (last 3)  No results for input(s): GLUCAP in the last 72 hours.   Labs:  CBC Latest Ref Rng 12/27/2015 12/26/2015 12/26/2015  WBC 4.0 - 10.5 K/uL 12.0(H) - 12.4(H)  Hemoglobin 13.0 - 17.0 g/dL 6.9(LL) 7.8(L) 5.8(LL)  Hematocrit 39.0 - 52.0 % 20.4(L) 23.6(L) 17.6(L)  Platelets 150 - 400 K/uL 386 - 442(H)    CMP Latest Ref Rng 12/27/2015 12/26/2015 12/25/2015  Glucose 65 - 99 mg/dL 110(H) 110(H) 108(H)  BUN 6 - 20 mg/dL '14 13 10  '$ Creatinine 0.61 - 1.24 mg/dL 0.33(L) 0.46(L) 0.45(L)  Sodium 135 - 145 mmol/L 133(L) 132(L) 130(L)  Potassium 3.5 - 5.1 mmol/L 4.0 4.0 4.3  Chloride 101 - 111 mmol/L 110 106 105  CO2 22 - 32 mmol/L 20(L) 20(L) 21(L)  Calcium 8.9 - 10.3 mg/dL 7.6(L) 7.5(L) 7.6(L)  Total Protein 6.4 - 8.3 g/dL - - -  Total Bilirubin 0.20 - 1.20 mg/dL - - -  Alkaline Phos 40 - 150 U/L - - -  AST 5 - 34 U/L - - -  ALT 0 - 55 U/L - - -     Urine Studies No results for input(s): UHGB, CRYS in the last 72 hours.  Invalid input(s): UACOL, UAPR, USPG, UPH, UTP, UGL, UKET, UBIL, UNIT, UROB, [place],  UEPI, UWBC, [place], [place], [place], [place], [place]  Basic Metabolic Panel:  Recent Labs Lab 12/23/15 1142  12/24/15 0058 12/25/15 0417 12/26/15 0521 12/27/15 0519  NA 126*  --  126* 130* 132* 133*  K 4.2  < > 3.9 4.3 4.0 4.0  CL  --   --  96* 105 106 110  CO2 23  --  23 21* 20* 20*  GLUCOSE 117  --  99 108* 110* 110*  BUN 9.9  --  '11 10 13 14  '$ CREATININE 0.6*  --  0.38* 0.45* 0.46* 0.33*  CALCIUM 8.5  --  7.7* 7.6* 7.5* 7.6*  < > = values in this interval not displayed. GFR Estimated Creatinine Clearance: 74.1 mL/min (by C-G formula based on Cr of 0.33). Liver Function Tests:  Recent Labs Lab 12/23/15 1142  AST 17  ALT 9  ALKPHOS 136  BILITOT <0.30  PROT 5.8*  ALBUMIN 2.3*   No results for input(s): LIPASE, AMYLASE in the last 168 hours. No results for input(s): AMMONIA in the last 168 hours. Coagulation profile No results for input(s): INR, PROTIME in the last 168 hours.  CBC:  Recent Labs Lab 12/23/15 1142  12/24/15 0058 12/25/15 0417 12/25/15 1508 12/26/15 0521 12/26/15 1820 12/27/15 0519  WBC 18.0*  --  14.9* 14.1*  --  12.4*  --  12.0*  NEUTROABS 16.0*  --   --   --   --   --   --   --   HGB 5.7*  < > 7.3* 6.1* 7.1* 5.8* 7.8* 6.9*  HCT 18.7*  < > 22.1* 19.0* 21.6* 17.6* 23.6* 20.4*  MCV 85.5  --  83.7 85.2  --  85.9  --  86.8  PLT 764*  --  598* 505*  --  442*  --  386  < > = values in this interval not displayed. Cardiac Enzymes: No results for input(s): CKTOTAL, CKMB, CKMBINDEX, TROPONINI in the last 168 hours. BNP: Invalid input(s): POCBNP CBG: No results for input(s): GLUCAP in the last 168 hours. D-Dimer No results for input(s): DDIMER in the last 72 hours. Hgb A1c No results for input(s): HGBA1C in the last 72 hours. Lipid Profile No results for input(s): CHOL, HDL, LDLCALC, TRIG, CHOLHDL, LDLDIRECT in the last 72 hours. Thyroid function studies No results for input(s): TSH, T4TOTAL, T3FREE, THYROIDAB in the last 72 hours.  Invalid input(s):  FREET3 Anemia work up No results for input(s): VITAMINB12, FOLATE, FERRITIN, TIBC, IRON, RETICCTPCT in the last 72 hours. Microbiology No results found for this or any previous visit (from the past 240 hour(s)).    Studies:  Ct Abdomen Pelvis W Contrast  12/25/2015  CLINICAL DATA:  Mid to lower abdominal pain. History of lung cancer and GI bleed. Anemia. Blood in stools. EXAM: CT ABDOMEN AND PELVIS WITH CONTRAST TECHNIQUE: Multidetector CT imaging of the abdomen and pelvis was performed using the standard protocol following bolus administration of intravenous contrast. CONTRAST:  171m ISOVUE-300 IOPAMIDOL (ISOVUE-300) INJECTION 61% COMPARISON:  PET-CT 11/20/2015 FINDINGS: Lower chest: Stable 4 mm right middle lobe pulmonary nodule. No other pulmonary lesions. There are very small pleural effusions and overlying atelectasis. Hepatobiliary: No focal hepatic lesions to suggest metastatic disease. The gallbladder is partially contracted. A small gallstone is noted. No common bile duct dilatation. Pancreas: No mass, inflammation or ductal dilatation. Spleen: Linear defect in the upper aspect of the spleen is most likely a this is new since the prior PET-CT. Splenic infarct. Adrenals/Urinary Tract: Stable bilateral adrenal gland lesions. The left adrenal gland lesion is likely an adenoma. The right adrenal gland is likely of metastasis based on the prior PET-CT. 2.8 cm enhancing lower pole right renal mass consistent with renal cell carcinoma. The left kidney is normal. Stomach/Bowel: The stomach is unremarkable. No obvious mass or ulcer. The duodenum appears normal. There appear to be numerous soft tissue lesions involving the small bowel and colon suspicious for metastasis. In retrospect I think the patchy [place] also. There is an acute inflammatory process between the proximal transverse colon and adjacent small bowel loop. This is most likely a a colonic process. It could be acute diverticulitis but more  likely a metastatic lesion that bled. Vascular/Lymphatic: Advanced atherosclerotic calcifications involving the aorta. No aneurysm or dissection. The major venous structures are patent. No mesenteric or retroperitoneal mass or adenopathy. Other: Small amount of free pelvic fluid. The bladder, prostate gland and seminal vesicles appear normal. No inguinal mass or adenopathy. Musculoskeletal: No significant bony findings. IMPRESSION: 1. Acute inflammatory process centered around the inferior margin of the proximal transverse colon and an adjacent loop of jejunum. Focal wall thickening of the  colon is likely the epicenter and could be due to a metastasis that has hemorrhaged. Diverticulitis is felt to be unlikely. The adjacent small bilobed also appears normal and there could be a metastasis in this area also. Colonoscopy may be helpful for diagnostic purposes. Upper Endoscopy may also be helpful as there are lesions in the third portion the duodenum. 2. Diffuse small bowel lesions suspicious for diffuse metastasis. In retrospect I think these were positive on the PET scan. 3. 2.8 cm enhancing right renal mass most consistent with renal cell cancer. 4. Small bilateral adrenal gland lesions. The right lesion was hypermetabolic on the recent PET-CT. The left lesion is a benign adenoma. These results were called by telephone at the time of interpretation on 12/25/2015 at 3:51 pm to Dr. Wilford Corner , who verbally acknowledged these results. Electronically Signed   By: Marijo Sanes M.D.   On: 12/25/2015 15:51    Assessment: 60 y.o. with recently diagnosed stage IV lung cancer, first chemotherapy treatment was held last week due to severe anemia and patient was admitted for GI bleeding  1. GI bleeding, possible secondary to metastasis to small bowel and colon  2. Metastatic lung adenocarcinoma to lung, right adrenal gland, and bowel 3, anemia secondary to GI bleeding and iron deficiency 4. Malnutrition secondary  to underlying malignancy 5. Thrombocytosis, likely reactive secondary to iron deficiency  Plan:  -He has started capsule endoscopy this morning, appreciate GI assistance and input  -continue blood transfusion to keep Hb above 8  -he will receive iv feraheme today -Dr. Julien Nordmann will follow up tomorrow   Truitt Merle, MD 12/27/2015  12:02 PM

## 2015-12-27 NOTE — Progress Notes (Signed)
CRITICAL VALUE ALERT  Critical value received:  Hgb 6.9  Date of notification:  12/27/15  Time of notification:  0555    Critical value read back:Yes.    Nurse who received alert:  Reynold Bowen   MD notified (1st page):  [place]  Time of first page:  0612  MD notified (2nd page):  Time of second page:  Responding MD:  [place]  Time MD responded:  [phone]  Order to transfuse 2 units

## 2015-12-27 NOTE — Progress Notes (Signed)
PROGRESS NOTE  David Warren ZOX:[id number] DOB: 04/26/1956 DOA: 12/23/2015 PCP: Maren Reamer, MD  HPI/Recap of past 51 hours: 60 year old male past history of recently diagnosed stage III adenocarcinoma of the lung initially followed up with oncology to start chemotherapy on 6/28 and that time found to be significantly anemic. Patient admitted to hospitalist service and received 2 units packed red blood cells. EGD done 6/29 by gastroneurology noted small recently bleeding gastric ulcer, however patient has continued to bleed requiring another unit of blood on 6/30 and then 2 more units on 7/1. Oncology followed up after discussion with gastroenterology, patient underwent Endoscopy Today, 7/2. Her hemoglobin improved after blood on 7/1, but then dropped a gram by 7/2 requiring more blood.   Patient seen post procedure Patient is non-English-speaking and through translator,complains only minimal abdominal discomfort in the lower quadrants but no other symptoms. No dizziness or trouble breathing.  Assessment/Plan: Principal Problem:  Acute blood loss anemia with underlying Iron deficiency anemia: Concerns for suspected metastases: Status post multiple transfusions.  continue keeping hemoglobin levels above 8. Awaiting results of capsule endoscopy. Best case scenario would be bleeding has stopped on its on or minimal if any metastases causing bleeding as patient cannot be a surgical candidate. IV iron 1 given.  Active Problems:   Adenocarcinoma of left lung, stage 4 [facility]): Outpatient chemotherapy to be initiated once stabilized, although oncology comments that if his small bowel metastases are causing this bleeding there are not really good treatment options. If he has localized bowel metastases possible surgical resection may help.See above   Acute blood loss anemia   Melena   Code Status: Full code   Family Communication: Multiple family members present   Disposition Plan: Depending on  outcome of capsule endoscopy     Consultants:  Oncology   Gastroenterology   Procedures:  EGD done 6/29: Noted recently bleeding gastric ulcer 1  Blood unit transfusions daily since 6/29.   Antimicrobials:  None   DVT prophylaxis:  \SCD   Objective: Filed Vitals:   12/27/15 1325 12/27/15 1326 12/27/15 1400 12/27/15 1612  BP: 145/58 145/57 151/62 155/52  Pulse: 86 87 91 92  Temp: 98.7 F (37.1 C) 98.7 F (37.1 C) 98.2 F (36.8 C) 99.4 F (37.4 C)  TempSrc: Oral Oral Oral Oral  Resp: '18 18 16 16  '$ Height:      Weight:      SpO2: 100% 100% 100% 100%    Intake/Output Summary (Last 24 hours) at 12/27/15 1621 Last data filed at 12/27/15 1612  Gross per 24 hour  Intake 2824.5 ml  Output      0 ml  Net 2824.5 ml   Filed Weights   12/23/15 1331 12/24/15 1004  Weight: 53.978 kg (119 lb) 53.978 kg (119 lb)    Exam:   General:  Alert and oriented 3, no acute distress   Cardiovascular: Regular rate and rhythm, S1-S2   Respiratory: Clear to auscultation bilaterally   Abdomen: Soft, nontender, nondistended, hypoactive bowel sounds   Musculoskeletal: No clubbing or cyanosis or edema   Skin: Large lipoma on neck  Psychiatry: Patient is appropriate, no evidence of psychoses    Data Reviewed: CBC:  Recent Labs Lab 12/23/15 1142  12/24/15 0058 12/25/15 0417 12/25/15 1508 12/26/15 0521 12/26/15 1820 12/27/15 0519  WBC 18.0*  --  14.9* 14.1*  --  12.4*  --  12.0*  NEUTROABS 16.0*  --   --   --   --   --   --   --  HGB 5.7*  < > 7.3* 6.1* 7.1* 5.8* 7.8* 6.9*  HCT 18.7*  < > 22.1* 19.0* 21.6* 17.6* 23.6* 20.4*  MCV 85.5  --  83.7 85.2  --  85.9  --  86.8  PLT 764*  --  598* 505*  --  442*  --  386  < > = values in this interval not displayed. Basic Metabolic Panel:  Recent Labs Lab 12/23/15 1142 12/24/15 0058 12/25/15 0417 12/26/15 0521 12/27/15 0519  NA 126* 126* 130* 132* 133*  K 4.2 3.9 4.3 4.0 4.0  CL  --  96* 105 106 110  CO2 23 23  21* 20* 20*  GLUCOSE 117 99 108* 110* 110*  BUN 9.'9 11 10 13 14  '$ CREATININE 0.6* 0.38* 0.45* 0.46* 0.33*  CALCIUM 8.5 7.7* 7.6* 7.5* 7.6*   GFR: Estimated Creatinine Clearance: 74.1 mL/min (by C-G formula based on Cr of 0.33). Liver Function Tests:  Recent Labs Lab 12/23/15 1142  AST 17  ALT 9  ALKPHOS 136  BILITOT <0.30  PROT 5.8*  ALBUMIN 2.3*   No results for input(s): LIPASE, AMYLASE in the last 168 hours. No results for input(s): AMMONIA in the last 168 hours. Coagulation Profile: No results for input(s): INR, PROTIME in the last 168 hours. Cardiac Enzymes: No results for input(s): CKTOTAL, CKMB, CKMBINDEX, TROPONINI in the last 168 hours. BNP (last 3 results) No results for input(s): PROBNP in the last 8760 hours. HbA1C: No results for input(s): HGBA1C in the last 72 hours. CBG: No results for input(s): GLUCAP in the last 168 hours. Lipid Profile: No results for input(s): CHOL, HDL, LDLCALC, TRIG, CHOLHDL, LDLDIRECT in the last 72 hours. Thyroid Function Tests: No results for input(s): TSH, T4TOTAL, FREET4, T3FREE, THYROIDAB in the last 72 hours. Anemia Panel: No results for input(s): VITAMINB12, FOLATE, FERRITIN, TIBC, IRON, RETICCTPCT in the last 72 hours. Urine analysis: No results found for: COLORURINE, APPEARANCEUR, LABSPEC, PHURINE, GLUCOSEU, HGBUR, BILIRUBINUR, KETONESUR, PROTEINUR, UROBILINOGEN, NITRITE, LEUKOCYTESUR Sepsis Labs: '@LABRCNTIP'$ (procalcitonin:4,lacticidven:4)  )No results found for this or any previous visit (from the past 240 hour(s)).    Studies: No results found.  Scheduled Meds: . sodium chloride   Intravenous Once  . folic acid  1 mg Oral Daily  . lisinopril  20 mg Oral Daily  . [START ON 12/28/2015] pantoprazole  40 mg Intravenous Q12H    Continuous Infusions: . 0.9 % NaCl with KCl 20 mEq / L 75 mL/hr at 12/27/15 0525     LOS: 4 days   Time spent: 15 minutes  Annita Brod, MD [facility] Pager  [phone]  If 7PM-7AM, please contact night-coverage [email or web address] Password [email or web address] 12/27/2015, 4:21 PM

## 2015-12-27 NOTE — Progress Notes (Signed)
Patient ID: David Warren, male   DOB: Nov 13, 1955, 60 y.o.   MRN: [id number] [facility] Gastroenterology Progress Note  Greene Diodato 60 y.o. 07/30/1955   Subjective: Continue to have black stools. Sitting in chair. Multiple family members in room. Denies abdominal pain  Objective: Vital signs: Filed Vitals:   12/27/15 1326 12/27/15 1400  BP: 145/57 151/62  Pulse: 87 91  Temp: 98.7 F (37.1 C) 98.2 F (36.8 C)  Resp: 18 16    Physical Exam: Gen: alert, no acute distress HEENT: anicteric sclera   Lab Results:  Recent Labs  12/26/15 0521 12/27/15 0519  NA 132* 133*  K 4.0 4.0  CL 106 110  CO2 20* 20*  GLUCOSE 110* 110*  BUN 13 14  CREATININE 0.46* 0.33*  CALCIUM 7.5* 7.6*   No results for input(s): AST, ALT, ALKPHOS, BILITOT, PROT, ALBUMIN in the last 72 hours.  Recent Labs  12/26/15 0521 12/26/15 1820 12/27/15 0519  WBC 12.4*  --  12.0*  HGB 5.8* 7.8* 6.9*  HCT 17.6* 23.6* 20.4*  MCV 85.9  --  86.8  PLT 442*  --  386      Assessment/Plan: Metastatic lung cancer with CT concerning for small bowel mets and/or proximal colonic mets with ongoing bleeding. I suspect he has multiple mets in the small bowel that are bleeding. S/P 2 U PRBCs yesterday with Hgb 7.8 (5.8). Hgb 6.9 and receiving 2 additional units of blood. Capsule endoscopy started today and hopefully results will be ready tomorrow afternoon/evening. Options are limited even if area of bleeding is focal because cannot stop bleeding with endoscope/colonoscope if bleeding is from a metastasis. Doubt he is a surgical candidate due to his metastatic disease. Angiogram unlikely to be possible with metastatic tumor burden and would favor comfort measures if bleeding continues unless no blood seen in small bowel and then can try colonoscopy to see if reversible source (if it is unrelated to mets) seen in colon.   [place] C. 12/27/2015, 3:10 PM  Pager [phone]  If no answer or after 5 PM call  [phone]

## 2015-12-28 DIAGNOSIS — K921 Melena: Secondary | ICD-10-CM

## 2015-12-28 DIAGNOSIS — R5383 Other fatigue: Secondary | ICD-10-CM

## 2015-12-28 DIAGNOSIS — D500 Iron deficiency anemia secondary to blood loss (chronic): Secondary | ICD-10-CM

## 2015-12-28 LAB — HEMOGLOBIN AND HEMATOCRIT, BLOOD
HCT: 23.4 % — ABNORMAL LOW (ref 39.0–52.0)
HEMATOCRIT: 26.7 % — AB (ref 39.0–52.0)
HEMOGLOBIN: 9.1 g/dL — AB (ref 13.0–17.0)
HEMOGLOBIN: 7.9 g/dL — AB (ref 13.0–17.0)

## 2015-12-28 LAB — TYPE AND SCREEN
ABO/RH(D): AB POS
ANTIBODY SCREEN: NEGATIVE
UNIT DIVISION: 0
UNIT DIVISION: 0
UNIT DIVISION: 0
Unit division: 0
Unit division: 0

## 2015-12-28 LAB — CBC
HCT: 22.7 % — ABNORMAL LOW (ref 39.0–52.0)
HEMOGLOBIN: 7.7 g/dL — AB (ref 13.0–17.0)
MCH: 29.7 pg (ref 26.0–34.0)
MCHC: 33.9 g/dL (ref 30.0–36.0)
MCV: 87.6 fL (ref 78.0–100.0)
Platelets: 344 10*3/uL (ref 150–400)
RBC: 2.59 MIL/uL — ABNORMAL LOW (ref 4.22–5.81)
RDW: 15.7 % — AB (ref 11.5–15.5)
WBC: 14.1 10*3/uL — ABNORMAL HIGH (ref 4.0–10.5)

## 2015-12-28 LAB — BASIC METABOLIC PANEL
Anion gap: 5 (ref 5–15)
BUN: 16 mg/dL (ref 6–20)
CALCIUM: 7.8 mg/dL — AB (ref 8.9–10.3)
CHLORIDE: 108 mmol/L (ref 101–111)
CO2: 21 mmol/L — ABNORMAL LOW (ref 22–32)
CREATININE: 0.43 mg/dL — AB (ref 0.61–1.24)
GFR calc Af Amer: >60 mL/min (ref >60)
GFR calc non Af Amer: >60 mL/min (ref >60)
Glucose, Bld: 105 mg/dL — ABNORMAL HIGH (ref 65–99)
Potassium: 3.8 mmol/L (ref 3.5–5.1)
SODIUM: 134 mmol/L — AB (ref 135–145)

## 2015-12-28 LAB — PREPARE RBC (CROSSMATCH)

## 2015-12-28 MED ORDER — SODIUM CHLORIDE 0.9 % IV SOLN
Freq: Once | INTRAVENOUS | Status: AC
  Administered 2015-12-28: 12:00:00 via INTRAVENOUS

## 2015-12-28 MED ORDER — FUROSEMIDE 10 MG/ML IJ SOLN
20.0000 mg | Freq: Once | INTRAMUSCULAR | Status: AC
  Administered 2015-12-28: 20 mg via INTRAVENOUS
  Filled 2015-12-28: qty 2

## 2015-12-28 MED ORDER — POLYETHYLENE GLYCOL 3350 17 G PO PACK: 17.0000 g | PACK | Freq: Four times a day (QID) | ORAL | Status: DC

## 2015-12-28 MED ORDER — POLYETHYLENE GLYCOL 3350 17 G PO PACK
17.0000 g | PACK | Freq: Four times a day (QID) | ORAL | Status: AC
  Administered 2015-12-28 – 2015-12-29 (×4): 17 g via ORAL
  Filled 2015-12-28 (×4): qty 1

## 2015-12-28 NOTE — Consult Note (Addendum)
Reason for Consult:  GI bleed Referring Physician: Dr. Lyman Speller  David Warren is an 60 y.o. male.  HPI: Pt with diagnosis fo Stage III adenocarcinoma of the lung via bronchoscopy and biopsy. Marland Kitchen  He presented for initial chemotherapy on 12/23/15 and was found to be anemic with a hemoglobin of 5.7.Hct of 18.7.  He has been transfused and evaluated by GI.  EGD on 12/24/15 shows a single 5 mm site mid gastric body. Capsule endoscopy show multiple small friable lesions throughout the jejunum and proxmimal ileum consistent with metastasis.  He continues to bleed and has had 8 units of packed cells since his admission. CT scan shows: Acute inflammatory process centered around the inferior margin of the proximal transverse colon and an adjacent loop of jejunum. Focal wall thickening of the colon is likely the epicenter and could be due to a metastasis that has hemorrhaged. Diverticulitis is felt to be unlikely. The adjacent small bilobed also appears normal and there could be a metastasis in this area also.  He also has a 2.8 cm enhancing lesion lower pole right renal mass consistent with renal cell carcinoma. The left kidney is normal.  With these findings we are ask to see.  He cannot have chemotherapy with bleeding, and if this is not amenable to treatment he will need to consider palliative treatment.   Past Medical History  Diagnosis Date  . Tobacco abuse   . Lipoma of neck   . Hypertension   . Seizures ([place])     once several years ago  . Adenocarcinoma of left lung, stage 3 ([place]) 11/13/2015    Past Surgical History  Procedure Laterality Date  . No past surgeries    . Electromagnetic navigation brochoscopy N/A 11/05/2015    Procedure: ELECTROMAGNETIC NAVIGATION BRONCHOSCOPY;  Surgeon: Flora Lipps, MD;  Location: [facility] ORS;  Service: Cardiopulmonary;  Laterality: N/A;  . Endobronchial ultrasound N/A 11/05/2015    Procedure: ENDOBRONCHIAL ULTRASOUND;  Surgeon: Flora Lipps, MD;  Location: [facility] ORS;   Service: Cardiopulmonary;  Laterality: N/A;  . Esophagogastroduodenoscopy (egd) with propofol N/A 12/24/2015    Procedure: ESOPHAGOGASTRODUODENOSCOPY (EGD) WITH PROPOFOL;  Surgeon: Wilford Corner, MD;  Location: [facility];  Service: Endoscopy;  Laterality: N/A;    Family History  Problem Relation Age of Onset  . Family history unknown: Yes    Social History:  reports that he quit smoking about 8 weeks ago. His smoking use included Cigarettes. He has a 35 pack-year smoking history. He has never used smokeless tobacco. He reports that he drinks about 7.2 oz of alcohol per week. He reports that he uses illicit drugs (Cocaine).  Allergies:  Allergies  Allergen Reactions  . Bee Venom Anaphylaxis    Medications:  Prior to Admission:  Prescriptions prior to admission  Medication Sig Dispense Refill Last Dose  . acetaminophen (TYLENOL) 500 MG tablet Take 650 mg by mouth every 6 (six) hours as needed.   12/23/2015 at Unknown time  . lisinopril-hydrochlorothiazide (PRINZIDE,ZESTORETIC) 20-25 MG tablet Take 1 tablet by mouth daily.   12/22/2015 at Unknown time  . folic acid (FOLVITE) 1 MG tablet Take 1 tablet (1 mg total) by mouth daily. 30 tablet 4 not started yet  . prochlorperazine (COMPAZINE) 10 MG tablet Take 1 tablet (10 mg total) by mouth every 6 (six) hours as needed for nausea or vomiting. (Patient not taking: Reported on 12/23/2015) 30 tablet 0 Not Taking   Scheduled: . sodium chloride   Intravenous Once  . folic acid  1 mg Oral Daily  . lisinopril  20 mg Oral Daily  . pantoprazole  40 mg Intravenous Q12H  . polyethylene glycol  17 g Oral QID   Continuous: . 0.9 % NaCl with KCl 20 mEq / L 75 mL/hr at 12/28/15 0621   LSL:[id number], diatrizoate meglumine-sodium, ondansetron **OR** ondansetron (ZOFRAN) IV, senna-docusate Anti-infectives    None      Results for orders placed or performed during the hospital encounter of 12/23/15 (from the past 48 hour(s))  Hemoglobin and  hematocrit, blood     Status: Abnormal   Collection Time: 12/26/15  6:20 PM  Result Value Ref Range   Hemoglobin 7.8 (L) 13.0 - 17.0 g/dL    Comment: RESULT REPEATED AND VERIFIED DELTA CHECK NOTED POST TRANSFUSION SPECIMEN    HCT 23.6 (L) 39.0 - 52.0 %  CBC     Status: Abnormal   Collection Time: 12/27/15  5:19 AM  Result Value Ref Range   WBC 12.0 (H) 4.0 - 10.5 K/uL   RBC 2.35 (L) 4.22 - 5.81 MIL/uL   Hemoglobin 6.9 (LL) 13.0 - 17.0 g/dL    Comment: REPEATED TO VERIFY CRITICAL RESULT CALLED TO, READ BACK BY AND VERIFIED WITH: L.HUDSON,RN 0557 12/27/15 WS[facility]A    HCT 20.4 (L) 39.0 - 52.0 %   MCV 86.8 78.0 - 100.0 fL   MCH 29.4 26.0 - 34.0 pg   MCHC 33.8 30.0 - 36.0 g/dL   RDW 15.6 (H) 11.5 - 15.5 %   Platelets 386 150 - 400 K/uL  Basic metabolic panel     Status: Abnormal   Collection Time: 12/27/15  5:19 AM  Result Value Ref Range   Sodium 133 (L) 135 - 145 mmol/L    Comment: REPEATED TO VERIFY   Potassium 4.0 3.5 - 5.1 mmol/L    Comment: REPEATED TO VERIFY   Chloride 110 101 - 111 mmol/L    Comment: REPEATED TO VERIFY   CO2 20 (L) 22 - 32 mmol/L    Comment: REPEATED TO VERIFY   Glucose, Bld 110 (H) 65 - 99 mg/dL   BUN 14 6 - 20 mg/dL   Creatinine, Ser 0.33 (L) 0.61 - 1.24 mg/dL   Calcium 7.6 (L) 8.9 - 10.3 mg/dL    Comment: REPEATED TO VERIFY   GFR calc non Af Amer >60 >60 mL/min   GFR calc Af Amer >60 >60 mL/min    Comment: (NOTE) The eGFR has been calculated using the CKD EPI equation. This calculation has not been validated in all clinical situations. eGFR's persistently <60 mL/min signify possible Chronic Kidney Disease.    Anion gap 3 (L) 5 - 15    Comment: REPEATED TO VERIFY  Type and screen [place]     Status: None (Preliminary result)   Collection Time: 12/27/15  7:38 AM  Result Value Ref Range   ABO/RH(D) AB POS    Antibody Screen NEG    Sample Expiration 12/30/2015    Unit Number [id number]    Blood Component Type RED  CELLS,LR    Unit division 00    Status of Unit ISSUED,FINAL    Transfusion Status OK TO TRANSFUSE    Crossmatch Result Compatible    Unit Number [id number]    Blood Component Type RED CELLS,LR    Unit division 00    Status of Unit ISSUED,FINAL    Transfusion Status OK TO TRANSFUSE    Crossmatch Result Compatible    Unit Number [id number]  Blood Component Type RED CELLS,LR    Unit division 00    Status of Unit ISSUED    Transfusion Status OK TO TRANSFUSE    Crossmatch Result Compatible   Prepare RBC     Status: None   Collection Time: 12/27/15  7:43 AM  Result Value Ref Range   Order Confirmation ORDER PROCESSED BY BLOOD BANK   Hemoglobin and hematocrit, blood     Status: Abnormal   Collection Time: 12/27/15  7:19 PM  Result Value Ref Range   Hemoglobin 9.1 (L) 13.0 - 17.0 g/dL    Comment: DELTA CHECK NOTED REPEATED TO VERIFY POST TRANSFUSION SPECIMEN    HCT 26.7 (L) 39.0 - 52.0 %  CBC     Status: Abnormal   Collection Time: 12/28/15  5:11 AM  Result Value Ref Range   WBC 14.1 (H) 4.0 - 10.5 K/uL   RBC 2.59 (L) 4.22 - 5.81 MIL/uL   Hemoglobin 7.7 (L) 13.0 - 17.0 g/dL   HCT 22.7 (L) 39.0 - 52.0 %   MCV 87.6 78.0 - 100.0 fL   MCH 29.7 26.0 - 34.0 pg   MCHC 33.9 30.0 - 36.0 g/dL   RDW 15.7 (H) 11.5 - 15.5 %   Platelets 344 150 - 400 K/uL  Basic metabolic panel     Status: Abnormal   Collection Time: 12/28/15  5:11 AM  Result Value Ref Range   Sodium 134 (L) 135 - 145 mmol/L   Potassium 3.8 3.5 - 5.1 mmol/L   Chloride 108 101 - 111 mmol/L   CO2 21 (L) 22 - 32 mmol/L   Glucose, Bld 105 (H) 65 - 99 mg/dL   BUN 16 6 - 20 mg/dL   Creatinine, Ser 0.43 (L) 0.61 - 1.24 mg/dL   Calcium 7.8 (L) 8.9 - 10.3 mg/dL   GFR calc non Af Amer >60 >60 mL/min   GFR calc Af Amer >60 >60 mL/min    Comment: (NOTE) The eGFR has been calculated using the CKD EPI equation. This calculation has not been validated in all clinical situations. eGFR's persistently <60 mL/min signify  possible Chronic Kidney Disease.    Anion gap 5 5 - 15  Prepare RBC     Status: None   Collection Time: 12/28/15  9:30 AM  Result Value Ref Range   Order Confirmation ORDER PROCESSED BY BLOOD BANK     No results found.  Review of Systems  All other systems reviewed and are negative.  Blood pressure 157/57, pulse 92, temperature 98.8 F (37.1 C), temperature source Oral, resp. rate 18, height '5\' 3"'$  (1.6 m), weight 53.978 kg (119 lb), SpO2 100 %. Physical Exam  Constitutional: He is oriented to person, place, and time. He appears well-developed and well-nourished. No distress.  Multiple family members in the room.  Discussion with Dr. Rosendo Gros in [place] with family members translating.    HENT:  Head: Normocephalic and atraumatic.  Eyes: Right eye exhibits no discharge. Left eye exhibits no discharge. No scleral icterus.  Neck: Normal range of motion. Neck supple. No JVD present. No tracheal deviation present. No thyromegaly present.  He has large lipomas posterior neck, left and right neck also.  He has declined to have them removed in the past.  Cardiovascular: Regular rhythm, normal heart sounds and intact distal pulses.   No murmur heard. He remains a bit tachycardic  Respiratory: Effort normal and breath sounds normal. No respiratory distress. He has no wheezes. He has no rales. He exhibits  no tenderness.  GI: Soft. Bowel sounds are normal. He exhibits distension. He exhibits no mass. There is tenderness. There is no rebound and no guarding.  Musculoskeletal: He exhibits no edema or tenderness.  Neurological: He is alert and oriented to person, place, and time. No cranial nerve deficit.  Skin: Skin is warm and dry. No rash noted. He is not diaphoretic. No erythema. No pallor.  Psychiatric: He has a normal mood and affect. His behavior is normal. Judgment and thought content normal.    Assessment/Plan: GI bleed with 8 units of blood transfused since 12/23/15 admission Stage III  lung cancer Multiple friable lesions throughout the jejunum and proximal ileum consistent with metastasis CT scan concerning for small bowel and proximal transverse colon metatasis Right lower pole renal mass consistent with renal cell carcinoma Hx of tobacco use Hypertension   Plan:  Dr. Rosendo Gros has seen and examined the patient.  He has reviewed the studies so far and discussed with Dr. Cristina Gong and Dr. [place] Pink.  At this point we would recommend a colonoscopy as the next step.  If there is a colon lesion along with SB lesions resection would not be an option.  If the area on the CT is not a colon mass, exploratory laparoscopy and small bowel resection is a possibility.  We will follow with you.  Agree with Dr. Lyman Speller plan to continue support till we have more information available.     Warren,David 12/28/2015, 4:03 PM     Addendum: Difficult case.  Pt with likely multp mets throughout SB which are likely the ulcers seen on capsule endoscopy, and also seen as lesion on CT scan.  ?Colonic mass as well.  It appears as though most of SB gi tract involved and limited surgical options.  Pt may end up needing palliative/hospice care.  Will follow.

## 2015-12-28 NOTE — Progress Notes (Addendum)
PROGRESS NOTE  David Warren IFO:[id number] DOB: Jan 31, 1956 DOA: 12/23/2015 PCP: Maren Reamer, MD  HPI/Recap of past 103 hours: 60 year old male past history of recently diagnosed stage 4 adenocarcinoma of the lung initially followed up with oncology to start chemotherapy on 6/28 and that time found to be significantly anemic. Patient admitted to hospitalist service and received 2 units packed red blood cells. EGD done 6/29 by gastroneurology noted small recently bleeding gastric ulcer, however patient has continued to bleed requiring another unit of blood on 6/30 and then 2 more units on 7/1 and since then has required daily transfusions to keep hemoglobin above 8. Oncology followed up after discussion with gastroenterology, patient underwent capsule endoscopy on 7/2. Results on 7/3 noted multiple friable lesions in the duodenum and jejunum consistent with metastases.  Patient seen post procedure Patient is non-English-speaking and through translator,complains only minimal abdominal discomfort in the lower quadrants but no other symptoms. No dizziness or trouble breathing.  Assessment/Plan: Principal Problem:  Acute blood loss anemia with underlying Iron deficiency anemia: Concerns for suspected metastases: Status post multiple transfusions.  continue keeping hemoglobin levels above 8. Patient looks to still have active bleeding. Have discussed with general surgery who evaluated patient to see if he is a surgical candidate. IV iron 1 given.   Addendum: Spoke with general surgery. Patient might be a surgical candidate. Complicated by a factor of holiday tomorrow. Overall, no other major medical problems.  We'll plan to optimize surgical survivability and general surgery will decide on Wednesday 7/5 if he is a surgical candidate.  Active Problems:   Adenocarcinoma of left lung, stage 4 [facility]): Outpatient chemotherapy to be initiated once stabilized, although oncology comments that if his small  bowel metastases are causing this bleeding there are not really good treatment options. If he has localized bowel metastases possible surgical resection may help.See above    Code Status: Full code   Family Communication: Multiple family members present   Disposition Plan: If patient is surgical candidate, surgery. Patient not surgical candidate, hospice.    Consultants:  Oncology   Gastroenterology   General surgery  Procedures:  EGD done 6/29: Noted recently bleeding gastric ulcer 1  Capsule endoscopy done 7/2: Multiple friable areas in small intestine consistent with metastatic disease  Blood unit transfusions daily since 6/29.   Antimicrobials:  None   DVT prophylaxis:  \SCD   Objective: Filed Vitals:   12/28/15 0933 12/28/15 1141 12/28/15 1207 12/28/15 1431  BP: 141/52 138/49 117/56 157/57  Pulse:  101 101 92  Temp:  98.1 F (36.7 C) 98.8 F (37.1 C) 98.8 F (37.1 C)  TempSrc:  Oral Oral Oral  Resp:  '18 18 18  '$ Height:      Weight:      SpO2:  100% 100% 100%    Intake/Output Summary (Last 24 hours) at 12/28/15 1558 Last data filed at 12/28/15 1431  Gross per 24 hour  Intake   1918 ml  Output      0 ml  Net   1918 ml   Filed Weights   12/23/15 1331 12/24/15 1004  Weight: 53.978 kg (119 lb) 53.978 kg (119 lb)    Exam: Unchanged from previous  General:  Alert and oriented 3, no acute distress   Cardiovascular: Regular rate and rhythm, S1-S2   Respiratory: Clear to auscultation bilaterally   Abdomen: Soft, nontender, nondistended, hypoactive bowel sounds   Musculoskeletal: No clubbing or cyanosis or edema   Skin: Large lipoma on neck  Psychiatry: Patient is appropriate, no evidence of psychoses    Data Reviewed: CBC:  Recent Labs Lab 12/23/15 1142  12/24/15 0058 12/25/15 0417  12/26/15 0521 12/26/15 1820 12/27/15 0519 12/27/15 1919 12/28/15 0511  WBC 18.0*  --  14.9* 14.1*  --  12.4*  --  12.0*  --  14.1*  NEUTROABS  16.0*  --   --   --   --   --   --   --   --   --   HGB 5.7*  < > 7.3* 6.1*  < > 5.8* 7.8* 6.9* 9.1* 7.7*  HCT 18.7*  < > 22.1* 19.0*  < > 17.6* 23.6* 20.4* 26.7* 22.7*  MCV 85.5  --  83.7 85.2  --  85.9  --  86.8  --  87.6  PLT 764*  --  598* 505*  --  442*  --  386  --  344  < > = values in this interval not displayed. Basic Metabolic Panel:  Recent Labs Lab 12/24/15 0058 12/25/15 0417 12/26/15 0521 12/27/15 0519 12/28/15 0511  NA 126* 130* 132* 133* 134*  K 3.9 4.3 4.0 4.0 3.8  CL 96* 105 106 110 108  CO2 23 21* 20* 20* 21*  GLUCOSE 99 108* 110* 110* 105*  BUN '11 10 13 14 16  '$ CREATININE 0.38* 0.45* 0.46* 0.33* 0.43*  CALCIUM 7.7* 7.6* 7.5* 7.6* 7.8*   GFR: Estimated Creatinine Clearance: 74.1 mL/min (by C-G formula based on Cr of 0.43). Liver Function Tests:  Recent Labs Lab 12/23/15 1142  AST 17  ALT 9  ALKPHOS 136  BILITOT <0.30  PROT 5.8*  ALBUMIN 2.3*   No results for input(s): LIPASE, AMYLASE in the last 168 hours. No results for input(s): AMMONIA in the last 168 hours. Coagulation Profile: No results for input(s): INR, PROTIME in the last 168 hours. Cardiac Enzymes: No results for input(s): CKTOTAL, CKMB, CKMBINDEX, TROPONINI in the last 168 hours. BNP (last 3 results) No results for input(s): PROBNP in the last 8760 hours. HbA1C: No results for input(s): HGBA1C in the last 72 hours. CBG: No results for input(s): GLUCAP in the last 168 hours. Lipid Profile: No results for input(s): CHOL, HDL, LDLCALC, TRIG, CHOLHDL, LDLDIRECT in the last 72 hours. Thyroid Function Tests: No results for input(s): TSH, T4TOTAL, FREET4, T3FREE, THYROIDAB in the last 72 hours. Anemia Panel: No results for input(s): VITAMINB12, FOLATE, FERRITIN, TIBC, IRON, RETICCTPCT in the last 72 hours. Urine analysis: No results found for: COLORURINE, APPEARANCEUR, LABSPEC, PHURINE, GLUCOSEU, HGBUR, BILIRUBINUR, KETONESUR, PROTEINUR, UROBILINOGEN, NITRITE, LEUKOCYTESUR Sepsis  Labs: '@LABRCNTIP'$ (procalcitonin:4,lacticidven:4)  )No results found for this or any previous visit (from the past 240 hour(s)).    Studies: No results found.  Scheduled Meds: . sodium chloride   Intravenous Once  . folic acid  1 mg Oral Daily  . lisinopril  20 mg Oral Daily  . pantoprazole  40 mg Intravenous Q12H  . polyethylene glycol  17 g Oral QID    Continuous Infusions: . 0.9 % NaCl with KCl 20 mEq / L 75 mL/hr at 12/28/15 0621     LOS: 5 days   Time spent: 15 minutes  Annita Brod, MD [facility] Pager [phone]  If 7PM-7AM, please contact night-coverage [email or web address] Password [email or web address] 12/28/2015, 3:58 PM

## 2015-12-28 NOTE — Progress Notes (Signed)
The patient is without complaints.  Per discussion with his daughter at the bedside, he continues to have dark stools, although their impression is that the quantity of the stool output is less than before.  The patient has had 7 units of packed cells over the past several days.   Moreover, his hemoglobin over the past 24 hours has levelled off at approximately 1 g higher than yesterday morning, following 2 units of packed cells. Therefore, there appears to be an inappropriately low rise in hemoglobin in response to transfusion, suggesting probable ongoing blood loss, which  appears to be averaging between 0.5 and 1.0 unit of blood per day.   The patient completed his capsule study yesterday. We hope to have the reading from that procedure by later today. Further management will be guided by findings of that exam.  In the meantime, in case the patient needs colonoscopic evaluation, I have started a prep with MiraLAX 4 times a day.  Cleotis Nipper, M.D. Pager [phone] If no answer or after 5 PM call [phone]

## 2015-12-28 NOTE — Progress Notes (Signed)
Capsule endoscopy read. The patient does have multiple friable small lesions gathered throughout the jejunum and proximal ileum consistent with metastases. Dr. Cristina Gong to follow up tomorrow

## 2015-12-28 NOTE — Progress Notes (Signed)
Subjective: The patient is seen and examined today. Several family members were at the bedside. He is feeling fine today with no specific complaints except for fatigue. He denied having any significant chest pain, shortness breath, cough or hemoptysis. He has no nausea or vomiting. No fever or chills. He underwent capsule endoscopy yesterday and the results are still pending  Objective: Vital signs in last 24 hours: Temp:  [98.1 F (36.7 C)-99.4 F (37.4 C)] 99.1 F (37.3 C) (07/03 0538) Pulse Rate:  [86-94] 94 (07/03 0538) Resp:  [16-18] 18 (07/03 0538) BP: (139-155)/(52-63) 143/63 mmHg (07/03 0538) SpO2:  [100 %] 100 % (07/03 0538)  Intake/Output from previous day: 07/02 0701 - 07/03 0700 In: 2377.5 [P.O.:240; I.V.:1350; Blood:787.5] Out: -  Intake/Output this shift:    General appearance: alert, cooperative, fatigued and no distress Resp: clear to auscultation bilaterally Cardio: regular rate and rhythm, S1, S2 normal, no murmur, click, rub or gallop GI: soft, non-tender; bowel sounds normal; no masses,  no organomegaly Extremities: extremities normal, atraumatic, no cyanosis or edema  Lab Results:   Recent Labs  12/27/15 0519 12/27/15 1919 12/28/15 0511  WBC 12.0*  --  14.1*  HGB 6.9* 9.1* 7.7*  HCT 20.4* 26.7* 22.7*  PLT 386  --  344   BMET  Recent Labs  12/27/15 0519 12/28/15 0511  NA 133* 134*  K 4.0 3.8  CL 110 108  CO2 20* 21*  GLUCOSE 110* 105*  BUN 14 16  CREATININE 0.33* 0.43*  CALCIUM 7.6* 7.8*    Studies/Results: No results found.  Medications: I have reviewed the patient's current medications.  CODE STATUS: Currently full code  Assessment/Plan: 1) metastatic non-small cell lung cancer, adenocarcinoma. The patient was supposed to start systemic chemotherapy with carboplatin, Alimta and Ketruda last week but unfortunately he had significant GI. And his treatment is currently on hold. I had a lengthy discussion with the patient and his family  today about his condition. I explained to them that I will not be able to start any systemic therapy until his gastrointestinal bleeding is resolved. I also discussed with him consideration of palliative care and hospice referral if he continues to have uncontrolled gastrointestinal bleeding. 2) gastrointestinal bleeding: The results of the capsule endoscopy is still pending. 3) persistent anemia secondary to #2. Continue supportive care with PRBCs transfusion to keep his hemoglobin above 8.0 G/DL. 4) prognosis: Poor. Thank you for taking good care of Mr. Swayze, I will continue to follow up the patient with you and assist in his management on as-needed basis.  LOS: 5 days    Michaelia Beilfuss K. 12/28/2015

## 2015-12-29 ENCOUNTER — Inpatient Hospital Stay (HOSPITAL_COMMUNITY): Payer: Self-pay

## 2015-12-29 LAB — PREPARE RBC (CROSSMATCH)

## 2015-12-29 LAB — HEMOGLOBIN: HEMOGLOBIN: 7 g/dL — AB (ref 13.0–17.0)

## 2015-12-29 MED ORDER — TECHNETIUM TC 99M-LABELED RED BLOOD CELLS IV KIT
25.0000 | PACK | Freq: Once | INTRAVENOUS | Status: AC | PRN
  Administered 2015-12-29: 24 via INTRAVENOUS

## 2015-12-29 MED ORDER — SODIUM CHLORIDE 0.9 % IV SOLN
Freq: Once | INTRAVENOUS | Status: AC
  Administered 2015-12-29: 11:00:00 via INTRAVENOUS

## 2015-12-29 NOTE — Progress Notes (Signed)
Patient ID: David Warren, male   DOB: 04-06-1956, 60 y.o.   MRN: [id number] [facility] Gastroenterology Progress Note  Aja Bolander 60 y.o. 1955/09/21   Subjective: Denies abdominal pain. No bleeding reported overnight. Multiple family members in room.  Objective: Vital signs in last 24 hours: Filed Vitals:   12/29/15 1430 12/29/15 1455  BP: 132/59   Pulse: 105   Temp: 99.3 F (37.4 C) 98.3 F (36.8 C)  Resp: 16     Physical Exam: Gen: alert, elderly, thin, no acute distress HEENT: anicteric sclera CV: RRR Chest: CTA B Abd: minimal epigastric tenderness without guarding, soft, nondistended, +BS  Lab Results:  Recent Labs  12/27/15 0519 12/28/15 0511  NA 133* 134*  K 4.0 3.8  CL 110 108  CO2 20* 21*  GLUCOSE 110* 105*  BUN 14 16  CREATININE 0.33* 0.43*  CALCIUM 7.6* 7.8*   No results for input(s): AST, ALT, ALKPHOS, BILITOT, PROT, ALBUMIN in the last 72 hours.  Recent Labs  12/27/15 0519  12/28/15 0511 12/28/15 2015 12/29/15 0759  WBC 12.0*  --  14.1*  --   --   HGB 6.9*  < > 7.7* 7.9* 7.0*  HCT 20.4*  < > 22.7* 23.4*  --   MCV 86.8  --  87.6  --   --   PLT 386  --  344  --   --   < > = values in this interval not displayed. No results for input(s): LABPROT, INR in the last 72 hours.    Assessment/Plan: Metastatic lung cancer with capsule endoscopy showing multiple bleeding lesions in the jejunum and ileum that is likely due to bleeding metastases. Surgery is recommending a colonoscopy to evaluate and see if there is a bleeding colonic lesion as well. Based on the capsule endoscopy findings he has diffuse areas of bleeding small intestinal mets that will not be stopped without surgery. If surgery team feels that a negative colonoscopy will lead to him being a candidate for small bowel resection, then we will consider doing a colonoscopy BUT if he is not a candidate for small bowel resection even if colonoscopy is negative then I do not think a colonoscopy should be  performed. Will do a bleeding scan to see if bleeding is seen in colon in addition to known bleeding from small bowel but if that scan is unrevealing and surgery continues to feel that a colonoscopy will help decide whether to do surgery, then will proceed with colonoscopic evaluation. [facility] will follow up tomorrow.   Collins C. 12/29/2015, 3:41 PM  Pager [phone]  If no answer or after 5 PM call [phone]

## 2015-12-29 NOTE — Progress Notes (Signed)
PROGRESS NOTE  David Warren QMV:[id number] DOB: [birth year] DOA: 12/23/2015 PCP: Maren Reamer, MD  HPI/Recap of past 7 hours: 60 year old male past history of recently diagnosed stage 4 adenocarcinoma of the lung initially followed up with oncology to start chemotherapy on 6/28 and that time found to be significantly anemic. Patient admitted to hospitalist service and received 2 units packed red blood cells. EGD done 6/29 by gastroneurology noted small recently bleeding gastric ulcer, however patient has continued to bleed requiring another unit of blood on 6/30 and then 2 more units on 7/1 and since then has required daily transfusions to keep hemoglobin above 8. Oncology followed up after discussion with gastroenterology, patient underwent capsule endoscopy on 7/2. Results on 7/3 noted multiple friable lesions in the duodenum and jejunum consistent with metastases.  General surgery and GI debating on what best next step is.? If area of bleeding is localized only to small bowel metastases or*more metastases past this. Surgery would like GI to do colonoscopy. GI recommending instead tagged red blood cell scan. Depending on results, surgery to be considered  Patient today doing okay. Non-English-speaking and through translator, no complaints. No abdominal pain or shortness of breath  Assessment/Plan: Principal Problem:  Acute blood loss anemia with underlying Iron deficiency anemia: Concerns for suspected metastases: Status post multiple transfusions.  continue keeping hemoglobin levels above 8. Patient looks to still have active bleeding. Have discussed with general surgery who evaluated patient to see if he is a surgical candidate. IV iron 1 given. Next step is for tagged red blood cell scan and depending on results, surgery decide if small bowel resection/if patient is surgical candidate  Active Problems:   Adenocarcinoma of left lung, stage 4 [facility]): Outpatient chemotherapy to be initiated  once stabilized, although oncology comments that if his small bowel metastases are causing this bleeding there are not really good treatment options. If he has localized bowel metastases possible surgical resection may help.See above  Code Status: Full code   Family Communication: Multiple family members present   Disposition Plan: If patient is surgical candidate, surgery. Patient not surgical candidate, hospice.    Consultants:  Oncology   Gastroenterology   General surgery  Procedures:  EGD done 6/29: Noted recently bleeding gastric ulcer 1  Capsule endoscopy done 7/2: Multiple friable areas in small intestine consistent with metastatic disease  Blood unit transfusions daily since 6/29.  Plan to tagged red cell scan 7/4   Antimicrobials:  None   DVT prophylaxis:  \SCD   Objective: Filed Vitals:   12/29/15 1327 12/29/15 1358 12/29/15 1430 12/29/15 1455  BP: 134/56 132/60 132/59   Pulse: 93 92 105   Temp: 98.8 F (37.1 C) 98.8 F (37.1 C) 99.3 F (37.4 C) 98.3 F (36.8 C)  TempSrc: Oral Oral Oral Oral  Resp: '18 16 16   '$ Height:      Weight:      SpO2: 100% 100% 100%     Intake/Output Summary (Last 24 hours) at 12/29/15 1520 Last data filed at 12/29/15 1427  Gross per 24 hour  Intake 2673.75 ml  Output      0 ml  Net 2673.75 ml   Filed Weights   12/23/15 1331 12/24/15 1004  Weight: 53.978 kg (119 lb) 53.978 kg (119 lb)    Exam: Unchanged from previous  General:  Alert and oriented 3, no acute distress   Cardiovascular: Regular rate and rhythm, S1-S2   Respiratory: Clear to auscultation bilaterally   Abdomen: Soft, nontender,  nondistended, hypoactive bowel sounds   Musculoskeletal: No clubbing or cyanosis or edema   Skin: Large lipoma on neck  Psychiatry: Patient is appropriate, no evidence of psychoses    Data Reviewed: CBC:  Recent Labs Lab 12/23/15 1142  12/24/15 0058 12/25/15 0417  12/26/15 0521 12/26/15 1820  12/27/15 0519 12/27/15 1919 12/28/15 0511 12/28/15 2015 12/29/15 0759  WBC 18.0*  --  14.9* 14.1*  --  12.4*  --  12.0*  --  14.1*  --   --   NEUTROABS 16.0*  --   --   --   --   --   --   --   --   --   --   --   HGB 5.7*  < > 7.3* 6.1*  < > 5.8* 7.8* 6.9* 9.1* 7.7* 7.9* 7.0*  HCT 18.7*  < > 22.1* 19.0*  < > 17.6* 23.6* 20.4* 26.7* 22.7* 23.4*  --   MCV 85.5  --  83.7 85.2  --  85.9  --  86.8  --  87.6  --   --   PLT 764*  --  598* 505*  --  442*  --  386  --  344  --   --   < > = values in this interval not displayed. Basic Metabolic Panel:  Recent Labs Lab 12/24/15 0058 12/25/15 0417 12/26/15 0521 12/27/15 0519 12/28/15 0511  NA 126* 130* 132* 133* 134*  K 3.9 4.3 4.0 4.0 3.8  CL 96* 105 106 110 108  CO2 23 21* 20* 20* 21*  GLUCOSE 99 108* 110* 110* 105*  BUN '11 10 13 14 16  '$ CREATININE 0.38* 0.45* 0.46* 0.33* 0.43*  CALCIUM 7.7* 7.6* 7.5* 7.6* 7.8*   GFR: Estimated Creatinine Clearance: 74.1 mL/min (by C-G formula based on Cr of 0.43). Liver Function Tests:  Recent Labs Lab 12/23/15 1142  AST 17  ALT 9  ALKPHOS 136  BILITOT <0.30  PROT 5.8*  ALBUMIN 2.3*   No results for input(s): LIPASE, AMYLASE in the last 168 hours. No results for input(s): AMMONIA in the last 168 hours. Coagulation Profile: No results for input(s): INR, PROTIME in the last 168 hours. Cardiac Enzymes: No results for input(s): CKTOTAL, CKMB, CKMBINDEX, TROPONINI in the last 168 hours. BNP (last 3 results) No results for input(s): PROBNP in the last 8760 hours. HbA1C: No results for input(s): HGBA1C in the last 72 hours. CBG: No results for input(s): GLUCAP in the last 168 hours. Lipid Profile: No results for input(s): CHOL, HDL, LDLCALC, TRIG, CHOLHDL, LDLDIRECT in the last 72 hours. Thyroid Function Tests: No results for input(s): TSH, T4TOTAL, FREET4, T3FREE, THYROIDAB in the last 72 hours. Anemia Panel: No results for input(s): VITAMINB12, FOLATE, FERRITIN, TIBC, IRON, RETICCTPCT  in the last 72 hours. Urine analysis: No results found for: COLORURINE, APPEARANCEUR, LABSPEC, PHURINE, GLUCOSEU, HGBUR, BILIRUBINUR, KETONESUR, PROTEINUR, UROBILINOGEN, NITRITE, LEUKOCYTESUR Sepsis Labs: '@LABRCNTIP'$ (procalcitonin:4,lacticidven:4)  )No results found for this or any previous visit (from the past 240 hour(s)).    Studies: No results found.  Scheduled Meds: . sodium chloride   Intravenous Once  . folic acid  1 mg Oral Daily  . lisinopril  20 mg Oral Daily  . pantoprazole  40 mg Intravenous Q12H    Continuous Infusions: . 0.9 % NaCl with KCl 20 mEq / L 75 mL/hr at 12/28/15 1940     LOS: 6 days   Time spent: 15 minutes  Annita Brod, MD [facility] Pager [phone]  If  7PM-7AM, please contact night-coverage [email or web address] Password [email or web address] 12/29/2015, 3:20 PM

## 2015-12-29 NOTE — Progress Notes (Signed)
Dr. Michail Sermon informed of GI Blood loss study, not new updated at this time. Will check patient in am and review with family. SRP, RN

## 2015-12-29 NOTE — Progress Notes (Signed)
Radiologist called and stated GI blood loss scan revealed source of bleeding is in small bowel. States there are multiple bleeds in the small bowel. On- call MD notified. Barbee Shropshire. Brigitte Pulse, RN

## 2015-12-29 NOTE — Progress Notes (Signed)
Iron deficiency anemia  Subjective: History: Pt with diagnosis fo Stage III adenocarcinoma of the lung via bronchoscopy and biopsy. Marland Kitchen He presented for initial chemotherapy on 12/23/15 and was found to be anemic with a hemoglobin of 5.7.Hct of 18.7. He has been transfused and evaluated by GI. EGD on 12/24/15 shows a single 5 mm site mid gastric body. Capsule endoscopy show multiple small friable lesions throughout the jejunum and proxmimal ileum consistent with metastasis. He continues to bleed and has had 8 units of packed cells since his admission. CT scan shows: Acute inflammatory process centered around the inferior margin of the proximal transverse colon and an adjacent loop of jejunum.   No new complaints.  Last Hgb, last night was stable  Objective: Vital signs in last 24 hours: Temp:  [98.1 F (36.7 C)-99.2 F (37.3 C)] 99.2 F (37.3 C) (07/04 0532) Pulse Rate:  [92-102] 102 (07/04 0532) Resp:  [18] 18 (07/04 0532) BP: (117-157)/(49-66) 135/58 mmHg (07/04 0532) SpO2:  [100 %] 100 % (07/04 0532) Last BM Date: 12/27/15  Intake/Output from previous day: 07/03 0701 - 07/04 0700 In: 2341.8 [I.V.:2023.8; Blood:318] Out: -  Intake/Output this shift:    General appearance: alert and cooperative GI: soft  Lab Results:  Results for orders placed or performed during the hospital encounter of 12/23/15 (from the past 24 hour(s))  Prepare RBC     Status: None   Collection Time: 12/28/15  9:30 AM  Result Value Ref Range   Order Confirmation ORDER PROCESSED BY BLOOD BANK   Hemoglobin and hematocrit, blood     Status: Abnormal   Collection Time: 12/28/15  8:15 PM  Result Value Ref Range   Hemoglobin 7.9 (L) 13.0 - 17.0 g/dL   HCT 23.4 (L) 39.0 - 52.0 %     Studies/Results Radiology     MEDS, Scheduled . sodium chloride   Intravenous Once  . folic acid  1 mg Oral Daily  . lisinopril  20 mg Oral Daily  . pantoprazole  40 mg Intravenous Q12H     Assessment: Iron  deficiency anemia GI bleed  Plan: Will recheck Hgb this am.  Pt with GI bleed, but surgery currently not an option unless source can be more localized.  Per Dr Rosendo Gros, a colonoscopy may help define his bleeding further, as there is a colonic lesion seen on CT.  Also could consider bleeding scan.  If no definitive source can be localized, we have to assume he is oozing from multiple sites, and surgery would not be a viable option.     LOS: 6 days    Rosario Adie, MD [facility], [place]   12/29/2015 7:34 AM

## 2015-12-30 ENCOUNTER — Encounter (HOSPITAL_COMMUNITY): Payer: Self-pay | Admitting: Gastroenterology

## 2015-12-30 ENCOUNTER — Other Ambulatory Visit: Payer: Self-pay

## 2015-12-30 LAB — BASIC METABOLIC PANEL
Anion gap: 6 (ref 5–15)
BUN: 17 mg/dL (ref 6–20)
CALCIUM: 7.5 mg/dL — AB (ref 8.9–10.3)
CHLORIDE: 105 mmol/L (ref 101–111)
CO2: 22 mmol/L (ref 22–32)
CREATININE: 0.43 mg/dL — AB (ref 0.61–1.24)
GFR calc non Af Amer: >60 mL/min (ref >60)
Glucose, Bld: 100 mg/dL — ABNORMAL HIGH (ref 65–99)
Potassium: 4.2 mmol/L (ref 3.5–5.1)
SODIUM: 133 mmol/L — AB (ref 135–145)

## 2015-12-30 LAB — CBC
HCT: 25.6 % — ABNORMAL LOW (ref 39.0–52.0)
HEMOGLOBIN: 8.7 g/dL — AB (ref 13.0–17.0)
MCH: 31.4 pg (ref 26.0–34.0)
MCHC: 34 g/dL (ref 30.0–36.0)
MCV: 92.4 fL (ref 78.0–100.0)
Platelets: 331 10*3/uL (ref 150–400)
RBC: 2.77 MIL/uL — ABNORMAL LOW (ref 4.22–5.81)
RDW: 16.3 % — AB (ref 11.5–15.5)
WBC: 14.8 10*3/uL — AB (ref 4.0–10.5)

## 2015-12-30 LAB — HEMOGLOBIN AND HEMATOCRIT, BLOOD
HCT: 28.1 % — ABNORMAL LOW (ref 39.0–52.0)
HCT: 22.6 % — ABNORMAL LOW (ref 39.0–52.0)
HEMOGLOBIN: 9.7 g/dL — AB (ref 13.0–17.0)
HEMOGLOBIN: 7.7 g/dL — AB (ref 13.0–17.0)

## 2015-12-30 LAB — PREPARE RBC (CROSSMATCH)

## 2015-12-30 MED ORDER — SIMETHICONE 80 MG PO CHEW
160.0000 mg | CHEWABLE_TABLET | Freq: Four times a day (QID) | ORAL | Status: DC | PRN
  Administered 2015-12-30: 160 mg via ORAL
  Filled 2015-12-30: qty 2

## 2015-12-30 MED ORDER — SODIUM CHLORIDE 0.9 % IV SOLN
Freq: Once | INTRAVENOUS | Status: AC
  Administered 2015-12-30: 21:00:00 via INTRAVENOUS

## 2015-12-30 MED ORDER — SODIUM CHLORIDE 0.9 % IV SOLN
INTRAVENOUS | Status: DC
  Administered 2015-12-30 (×2): via INTRAVENOUS

## 2015-12-30 NOTE — Progress Notes (Signed)
3 Days Post-Op  Subjective: No real complaints some pain lower abdomen, more right than left.  Family are in room again and translating.    Objective: Vital signs in last 24 hours: Temp:  [98.3 F (36.8 C)-99.4 F (37.4 C)] 98.9 F (37.2 C) (07/05 0503) Pulse Rate:  [90-105] 96 (07/05 0503) Resp:  [16-18] 16 (07/05 0503) BP: (132-153)/(54-96) 141/62 mmHg (07/05 0503) SpO2:  [98 %-100 %] 98 % (07/05 0503) Last BM Date: 12/29/15 NPO Voided x 2 recorded Afebrile, VSS Transfused 2 more units on 12/29/15 Dropped 1 gram since yesterday Bleeding scan:  Imaging findings favor a active small bowel bleed. On the study from 12/25/2015 the patient was noted to have multifocal small bowel lesions which may reflect metastatic disease from patient's stage IV adenocarcinoma of the lung.   Intake/Output from previous day: 07/04 0701 - 07/05 0700 In: 2201.3 [I.V.:1456.3; Blood:745] Out: -  Intake/Output this shift:    General appearance: alert, cooperative and no distress GI: soft, mildly distended, family says less than yesterday.  Few BS, slightly tender right lower abdomen more than the left  Lab Results:   Recent Labs  12/28/15 0511  12/29/15 1858 12/30/15 0444  WBC 14.1*  --   --  14.8*  HGB 7.7*  < > 9.7* 8.7*  HCT 22.7*  < > 28.1* 25.6*  PLT 344  --   --  331  < > = values in this interval not displayed.  BMET  Recent Labs  12/28/15 0511 12/30/15 0444  NA 134* 133*  K 3.8 4.2  CL 108 105  CO2 21* 22  GLUCOSE 105* 100*  BUN 16 17  CREATININE 0.43* 0.43*  CALCIUM 7.8* 7.5*   PT/INR No results for input(s): LABPROT, INR in the last 72 hours.   Recent Labs Lab 12/23/15 1142  AST 17  ALT 9  ALKPHOS 136  BILITOT <0.30  PROT 5.8*  ALBUMIN 2.3*     Lipase  No results found for: LIPASE   Studies/Results: Nm Gi Blood Loss  12/29/2015  CLINICAL DATA:  Evaluate GI bleed EXAM: NUCLEAR MEDICINE GASTROINTESTINAL BLEEDING SCAN TECHNIQUE: Sequential abdominal images  were obtained following intravenous administration of Tc-16mlabeled red blood cells. RADIOPHARMACEUTICALS:  24.0 mCi Tc-954mn-vitro labeled red cells. COMPARISON:  None. FINDINGS: Dynamic imaging was performed for 120 minutes. The specific imaging features of active large bowel bleeding is not visualized. More apparent on the second hour are areas of increased radiotracer uptake within both sides of the abdomen which follow the expected course of the small bowel. IMPRESSION: Imaging findings favor a active small bowel bleed. On the study from 12/25/2015 the patient was noted to have multifocal small bowel lesions which may reflect metastatic disease from patient's stage IV adenocarcinoma of the lung. Electronically Signed   By: TaKerby Moors.D.   On: 12/29/2015 19:05    Medications: . sodium chloride   Intravenous Once  . folic acid  1 mg Oral Daily  . lisinopril  20 mg Oral Daily  . pantoprazole  40 mg Intravenous Q12H   . sodium chloride 75 mL/hr at 12/30/15 0944    Assessment/Plan GI bleed with 10 units of blood transfused since 12/23/15 admission Stage III lung cancer Multiple friable lesions throughout the jejunum and proximal ileum consistent with metastasis CT scan concerning for small bowel and proximal transverse colon metatasis Right lower pole renal mass consistent with renal cell carcinoma Hx of tobacco use Hypertension FEN:  NPO/IV fluids ID:  None DVT:  SCD's only  Plan:  Dr. Rosendo Gros with review findings with radiology.    LOS: 7 days    David Warren 12/30/2015 [phone]

## 2015-12-30 NOTE — Progress Notes (Signed)
PROGRESS NOTE  Ozell Juhasz WCB:[id number] DOB: Jan 19, 1956 DOA: 12/23/2015 PCP: Maren Reamer, MD  HPI/Recap of past 81 hours: 60 year old male past history of recently diagnosed stage 4 adenocarcinoma of the lung initially followed up with oncology to start chemotherapy on 6/28 and that time found to be significantly anemic. Patient admitted to hospitalist service and received 2 units packed red blood cells. EGD done 6/29 by gastroneurology noted small recently bleeding gastric ulcer, however patient has continued to bleed requiring another unit of blood on 6/30 and then 2 more units on 7/1 and since then has required daily transfusions to keep hemoglobin above 8. Oncology followed up after discussion with gastroenterology, patient underwent capsule endoscopy on 7/2. Results on 7/3 noted multiple friable lesions in the duodenum and jejunum consistent with metastases.  General surgery and GI debating on what best next step is.? If area of bleeding is localized only to small bowel metastases or*more metastases past this. Surgery would like GI to do colonoscopy. GI recommending instead tagged red blood cell scan. Depending on results, surgery to be considered  Subjective;  He denies abdominal pain.  Multiples family member at bedside. He report mild lower quadrant abdominal pain. BM yesterday still black.    Assessment/Plan:  Acute blood loss anemia with underlying Iron deficiency anemia: Concerns for suspected metastases: -Status post multiple transfusions.  continue keeping hemoglobin levels above 8.  -IV iron 1 given. -tagged red blood cell scan: active small bowel bleed.  -surgery decide if small bowel resection/if patient is surgical candidate -repeat hb tonight if trending down will need blood transfusion.     Adenocarcinoma of left lung, stage 4 [facility]): oncology comments that if his small bowel metastases are causing this bleeding there are not really good treatment options. If he has  localized bowel metastases possible surgical resection may help. See above  Code Status: Full code   Family Communication: Multiple family members present   Disposition Plan: If patient is surgical candidate, surgery. Patient not surgical candidate, hospice.    Consultants:  Oncology   Gastroenterology   General surgery  Procedures:  EGD done 6/29: Noted recently bleeding gastric ulcer 1  Capsule endoscopy done 7/2: Multiple friable areas in small intestine consistent with metastatic disease  Blood unit transfusions daily since 6/29.  Plan to tagged red cell scan 7/4   Antimicrobials:  None   DVT prophylaxis:  \SCD   Objective: Filed Vitals:   12/29/15 1630 12/29/15 2139 12/30/15 0503 12/30/15 1500  BP: 149/57 140/59 141/62 140/60  Pulse: 100 90 96 105  Temp: 98.9 F (37.2 C) 99.4 F (37.4 C) 98.9 F (37.2 C) 98.9 F (37.2 C)  TempSrc: Oral Oral Oral Oral  Resp: '18 18 16 18  '$ Height:      Weight:      SpO2: 100% 100% 98% 100%    Intake/Output Summary (Last 24 hours) at 12/30/15 1730 Last data filed at 12/30/15 1500  Gross per 24 hour  Intake  717.5 ml  Output      0 ml  Net  717.5 ml   Filed Weights   12/23/15 1331 12/24/15 1004  Weight: 53.978 kg (119 lb) 53.978 kg (119 lb)    Exam: Unchanged from previous  General:  Alert and oriented 3, no acute distress   Cardiovascular: Regular rate and rhythm, S1-S2   Respiratory: Clear to auscultation bilaterally   Abdomen: Soft, nontender, nondistended, hypoactive bowel sounds   Musculoskeletal: No clubbing or cyanosis or edema  Skin: Large lipoma on neck  Psychiatry: Patient is appropriate, no evidence of psychoses    Data Reviewed: CBC:  Recent Labs Lab 12/25/15 0417  12/26/15 0521  12/27/15 0519 12/27/15 1919 12/28/15 0511 12/28/15 2015 12/29/15 0759 12/29/15 1858 12/30/15 0444  WBC 14.1*  --  12.4*  --  12.0*  --  14.1*  --   --   --  14.8*  HGB 6.1*  < > 5.8*  < > 6.9*  9.1* 7.7* 7.9* 7.0* 9.7* 8.7*  HCT 19.0*  < > 17.6*  < > 20.4* 26.7* 22.7* 23.4*  --  28.1* 25.6*  MCV 85.2  --  85.9  --  86.8  --  87.6  --   --   --  92.4  PLT 505*  --  442*  --  386  --  344  --   --   --  331  < > = values in this interval not displayed. Basic Metabolic Panel:  Recent Labs Lab 12/25/15 0417 12/26/15 0521 12/27/15 0519 12/28/15 0511 12/30/15 0444  NA 130* 132* 133* 134* 133*  K 4.3 4.0 4.0 3.8 4.2  CL 105 106 110 108 105  CO2 21* 20* 20* 21* 22  GLUCOSE 108* 110* 110* 105* 100*  BUN '10 13 14 16 17  '$ CREATININE 0.45* 0.46* 0.33* 0.43* 0.43*  CALCIUM 7.6* 7.5* 7.6* 7.8* 7.5*   GFR: Estimated Creatinine Clearance: 74.1 mL/min (by C-G formula based on Cr of 0.43). Liver Function Tests: No results for input(s): AST, ALT, ALKPHOS, BILITOT, PROT, ALBUMIN in the last 168 hours. No results for input(s): LIPASE, AMYLASE in the last 168 hours. No results for input(s): AMMONIA in the last 168 hours. Coagulation Profile: No results for input(s): INR, PROTIME in the last 168 hours. Cardiac Enzymes: No results for input(s): CKTOTAL, CKMB, CKMBINDEX, TROPONINI in the last 168 hours. BNP (last 3 results) No results for input(s): PROBNP in the last 8760 hours. HbA1C: No results for input(s): HGBA1C in the last 72 hours. CBG: No results for input(s): GLUCAP in the last 168 hours. Lipid Profile: No results for input(s): CHOL, HDL, LDLCALC, TRIG, CHOLHDL, LDLDIRECT in the last 72 hours. Thyroid Function Tests: No results for input(s): TSH, T4TOTAL, FREET4, T3FREE, THYROIDAB in the last 72 hours. Anemia Panel: No results for input(s): VITAMINB12, FOLATE, FERRITIN, TIBC, IRON, RETICCTPCT in the last 72 hours. Urine analysis: No results found for: COLORURINE, APPEARANCEUR, LABSPEC, PHURINE, GLUCOSEU, HGBUR, BILIRUBINUR, KETONESUR, PROTEINUR, UROBILINOGEN, NITRITE, LEUKOCYTESUR Sepsis Labs: '@LABRCNTIP'$ (procalcitonin:4,lacticidven:4)  )No results found for this or any  previous visit (from the past 240 hour(s)).    Studies: Nm Gi Blood Loss  12/29/2015  CLINICAL DATA:  Evaluate GI bleed EXAM: NUCLEAR MEDICINE GASTROINTESTINAL BLEEDING SCAN TECHNIQUE: Sequential abdominal images were obtained following intravenous administration of Tc-67mlabeled red blood cells. RADIOPHARMACEUTICALS:  24.0 mCi Tc-976mn-vitro labeled red cells. COMPARISON:  None. FINDINGS: Dynamic imaging was performed for 120 minutes. The specific imaging features of active large bowel bleeding is not visualized. More apparent on the second hour are areas of increased radiotracer uptake within both sides of the abdomen which follow the expected course of the small bowel. IMPRESSION: Imaging findings favor a active small bowel bleed. On the study from 12/25/2015 the patient was noted to have multifocal small bowel lesions which may reflect metastatic disease from patient's stage IV adenocarcinoma of the lung. Electronically Signed   By: TaKerby Moors.D.   On: 12/29/2015 19:05    Scheduled Meds: .  sodium chloride   Intravenous Once  . folic acid  1 mg Oral Daily  . lisinopril  20 mg Oral Daily  . pantoprazole  40 mg Intravenous Q12H    Continuous Infusions: . sodium chloride 75 mL/hr at 12/30/15 0944     LOS: 7 days   Time spent: 15 minutes  Elmarie Shiley, MD [facility] Pager [phone]  If 7PM-7AM, please contact night-coverage [email or web address] Password [email or web address] 12/30/2015, 5:30 PM

## 2015-12-30 NOTE — Progress Notes (Signed)
[facility] Progress Note  Subjective: Mild lower abdominal pain  Objective: Vital signs in last 24 hours: Temp:  [98.3 F (36.8 C)-99.4 F (37.4 C)] 98.9 F (37.2 C) (07/05 0503) Pulse Rate:  [90-105] 96 (07/05 0503) Resp:  [16-18] 16 (07/05 0503) BP: (132-153)/(54-96) 141/62 mmHg (07/05 0503) SpO2:  [98 %-100 %] 98 % (07/05 0503) Weight change:    PE: Unchanged  Lab Results: Results for orders placed or performed during the hospital encounter of 12/23/15 (from the past 24 hour(s))  Hemoglobin and hematocrit, blood     Status: Abnormal   Collection Time: 12/29/15  6:58 PM  Result Value Ref Range   Hemoglobin 9.7 (L) 13.0 - 17.0 g/dL   HCT 28.1 (L) 39.0 - 52.0 %  CBC     Status: Abnormal   Collection Time: 12/30/15  4:44 AM  Result Value Ref Range   WBC 14.8 (H) 4.0 - 10.5 K/uL   RBC 2.77 (L) 4.22 - 5.81 MIL/uL   Hemoglobin 8.7 (L) 13.0 - 17.0 g/dL   HCT 25.6 (L) 39.0 - 52.0 %   MCV 92.4 78.0 - 100.0 fL   MCH 31.4 26.0 - 34.0 pg   MCHC 34.0 30.0 - 36.0 g/dL   RDW 16.3 (H) 11.5 - 15.5 %   Platelets 331 150 - 400 K/uL  Basic metabolic panel     Status: Abnormal   Collection Time: 12/30/15  4:44 AM  Result Value Ref Range   Sodium 133 (L) 135 - 145 mmol/L   Potassium 4.2 3.5 - 5.1 mmol/L   Chloride 105 101 - 111 mmol/L   CO2 22 22 - 32 mmol/L   Glucose, Bld 100 (H) 65 - 99 mg/dL   BUN 17 6 - 20 mg/dL   Creatinine, Ser 0.43 (L) 0.61 - 1.24 mg/dL   Calcium 7.5 (L) 8.9 - 10.3 mg/dL   GFR calc non Af Amer >60 >60 mL/min   GFR calc Af Amer >60 >60 mL/min   Anion gap 6 5 - 15    Studies/Results: Nm Gi Blood Loss  12/29/2015  CLINICAL DATA:  Evaluate GI bleed EXAM: NUCLEAR MEDICINE GASTROINTESTINAL BLEEDING SCAN TECHNIQUE: Sequential abdominal images were obtained following intravenous administration of Tc-67mlabeled red blood cells. RADIOPHARMACEUTICALS:  24.0 mCi Tc-966mn-vitro labeled red cells. COMPARISON:  None. FINDINGS: Dynamic imaging was performed  for 120 minutes. The specific imaging features of active large bowel bleeding is not visualized. More apparent on the second hour are areas of increased radiotracer uptake within both sides of the abdomen which follow the expected course of the small bowel. IMPRESSION: Imaging findings favor a active small bowel bleed. On the study from 12/25/2015 the patient was noted to have multifocal small bowel lesions which may reflect metastatic disease from patient's stage IV adenocarcinoma of the lung. Electronically Signed   By: TaKerby Moors.D.   On: 12/29/2015 19:05      Assessment: Metastatic lung cancer to small bowel and elsewhere, confirmed by capsule endoscopy, active small bowel bleeding on pooled RBC scan. Inflammatory process of the sigmoid colon suggested by CT scan on June 30 not felt consistent with diverticulitis  Plan: Await surgery and oncology updated in put pertaining to the pooled RBC scan. Please call if colonoscopy felt needed to make management decisions.  David Warren C 12/30/2015, 10:18 AM  Pager [phone] no answer or after 5 PM call [phone]

## 2015-12-30 NOTE — Progress Notes (Signed)
Taking over care of patient, agree with previous RN assessment. Patient and family deny any needs at this time. Will continue to monitor.

## 2015-12-31 ENCOUNTER — Telehealth: Payer: Self-pay | Admitting: Internal Medicine

## 2015-12-31 DIAGNOSIS — Z7189 Other specified counseling: Secondary | ICD-10-CM

## 2015-12-31 DIAGNOSIS — Z515 Encounter for palliative care: Secondary | ICD-10-CM | POA: Insufficient documentation

## 2015-12-31 LAB — HEMOGLOBIN AND HEMATOCRIT, BLOOD
HCT: 24.4 % — ABNORMAL LOW (ref 39.0–52.0)
HEMOGLOBIN: 8.3 g/dL — AB (ref 13.0–17.0)

## 2015-12-31 LAB — PREPARE RBC (CROSSMATCH)

## 2015-12-31 MED ORDER — SODIUM CHLORIDE 0.9 % IV SOLN: Freq: Once | INTRAVENOUS | Status: DC

## 2015-12-31 MED ORDER — ONDANSETRON HCL 4 MG PO TABS: 4.0000 mg | ORAL_TABLET | Freq: Four times a day (QID) | ORAL | Status: AC | PRN

## 2015-12-31 MED ORDER — PANTOPRAZOLE SODIUM 40 MG PO TBEC: 40.0000 mg | DELAYED_RELEASE_TABLET | Freq: Two times a day (BID) | ORAL | Status: DC

## 2015-12-31 MED ORDER — OXYCODONE-ACETAMINOPHEN 5-325 MG PO TABS: 1.0000 | ORAL_TABLET | ORAL | Status: DC | PRN

## 2015-12-31 NOTE — Care Management Note (Signed)
Case Management Note  Patient Details  Name: Terrace Fontanilla MRN: [id number] Date of Birth: 22-Sep-1955  Subjective/Objective:                  GI bleed/CA mets Action/Plan: Discharge planning Expected Discharge Date:   (UNKNOWN)               Expected Discharge Plan:  Home w Hospice Care  In-House Referral:  Interpreting Services  Discharge planning Services  CM Consult  Post Acute Care Choice:    Choice offered to:  Patient, Adult Children  DME Arranged:    DME Agency:  [facility]:    [facility] Agency:     Status of Service:  Completed, signed off  If discussed at [employer], dates discussed:    Additional Comments: CM met with pt and family.  Family refuses interpretive services as Kathlee Nations, daughter, is [place] speaking, and is making decisions for pt.  Kathlee Nations chooses [facility].  CM notified Lehr (who is on unit) with referral.  No other CM needs were communicated. Dellie Catholic, RN 12/31/2015, 11:25 AM

## 2015-12-31 NOTE — Progress Notes (Signed)
PROGRESS NOTE  David Warren POE:[id number] DOB: Apr 04, 1956 DOA: 12/23/2015 PCP: David Reamer, MD  HPI/Recap of past 79 hours: 60 year old male past history of recently diagnosed stage 4 adenocarcinoma of the lung initially followed up with oncology to start chemotherapy on 6/28 and that time found to be significantly anemic. Patient admitted to hospitalist service and received 2 units packed red blood cells. EGD done 6/29 by gastroneurology noted small recently bleeding gastric ulcer, however patient has continued to bleed requiring another unit of blood on 6/30 and then 2 more units on 7/1 and since then has required daily transfusions to keep hemoglobin above 8. Oncology followed up after discussion with gastroenterology, patient underwent capsule endoscopy on 7/2. Results on 7/3 noted multiple friable lesions in the duodenum and jejunum consistent with metastases.  General surgery and GI debating on what best next step is.? If area of bleeding is localized only to small bowel metastases or*more metastases past this. Surgery would like GI to do colonoscopy. GI recommending instead tagged red blood cell scan. Depending on results, surgery to be considered  Subjective;  Denies abdominal pain, had 2 BM yesterday, melena.   Assessment/Plan:  Acute blood loss anemia with underlying Iron deficiency anemia: Concerns for suspected metastases: -Status post multiple transfusions.  continue keeping hemoglobin levels above 8.  -IV iron 1 given. -tagged red blood cell scan: active small bowel bleed.  -surgery decide if small bowel resection/if patient is surgical candidate -received one unit PRBC 7-05.   Long discussion with patient and family. Explain to them limitation on David Warren care. He is not a candidate for surgery. No others options to stop bleeding. Explain to them risk of multiples blood transfusion. They are asking about outpatient transfusion.  Explain to them risk of  going home and not  able to get transfusion on time. Palliative care consulted. Palliative will discussed with David Warren regarding out patient blood transfusion. Also explain to them that David Warren be able to give him chemo while he is bleeding. I discussed Code status, David Warren does not want to be resuscitated.     Adenocarcinoma of left lung, stage 4 [facility]): oncology comments that if his small bowel metastases are causing this bleeding there are not really good treatment options. If he has localized bowel metastases possible surgical resection may help. See above  Code Status: DNR  Family Communication: Multiple family members present   Disposition Plan: If patient is surgical candidate, surgery. Patient not surgical candidate, hospice.    Consultants:  Oncology   Gastroenterology   General surgery  Procedures:  EGD done 6/29: Noted recently bleeding gastric ulcer 1  Capsule endoscopy done 7/2: Multiple friable areas in small intestine consistent with metastatic disease  Blood unit transfusions daily since 6/29.  Plan to tagged red cell scan 7/4   Antimicrobials:  None   DVT prophylaxis:  \SCD   Objective: Filed Vitals:   12/30/15 2204 12/30/15 2238 12/31/15 0058 12/31/15 0532  BP: 141/59 133/47 132/49 150/58  Pulse: 96 87 83 85  Temp: 98.2 F (36.8 C) 98.7 F (37.1 C) 98.1 F (36.7 C) 98.4 F (36.9 C)  TempSrc: Oral Oral Oral Oral  Resp: '16 18 16 18  '$ Height:      Weight:      SpO2: 100% 100% 100% 100%    Intake/Output Summary (Last 24 hours) at 12/31/15 0824 Last data filed at 12/31/15 0602  Gross per 24 hour  Intake 1647.92 ml  Output  0 ml  Net 1647.92 ml   Filed Weights   12/23/15 1331 12/24/15 1004  Weight: 53.978 kg (119 lb) 53.978 kg (119 lb)    Exam: Unchanged from previous  General:  Alert and oriented 3, no acute distress   Cardiovascular: Regular rate and rhythm, S1-S2   Respiratory: Clear to auscultation bilaterally   Abdomen: Soft,  nontender, nondistended, hypoactive bowel sounds   Musculoskeletal: No clubbing or cyanosis or edema   Skin: Large lipoma on neck  Psychiatry: Patient is appropriate, no evidence of psychoses    Data Reviewed: CBC:  Recent Labs Lab 12/25/15 0417  12/26/15 0521  12/27/15 0519  12/28/15 0511 12/28/15 2015 12/29/15 0759 12/29/15 1858 12/30/15 0444 12/30/15 1739 12/31/15 0734  WBC 14.1*  --  12.4*  --  12.0*  --  14.1*  --   --   --  14.8*  --   --   HGB 6.1*  < > 5.8*  < > 6.9*  < > 7.7* 7.9* 7.0* 9.7* 8.7* 7.7* 8.3*  HCT 19.0*  < > 17.6*  < > 20.4*  < > 22.7* 23.4*  --  28.1* 25.6* 22.6* 24.4*  MCV 85.2  --  85.9  --  86.8  --  87.6  --   --   --  92.4  --   --   PLT 505*  --  442*  --  386  --  344  --   --   --  331  --   --   < > = values in this interval not displayed. Basic Metabolic Panel:  Recent Labs Lab 12/25/15 0417 12/26/15 0521 12/27/15 0519 12/28/15 0511 12/30/15 0444  NA 130* 132* 133* 134* 133*  K 4.3 4.0 4.0 3.8 4.2  CL 105 106 110 108 105  CO2 21* 20* 20* 21* 22  GLUCOSE 108* 110* 110* 105* 100*  BUN '10 13 14 16 17  '$ CREATININE 0.45* 0.46* 0.33* 0.43* 0.43*  CALCIUM 7.6* 7.5* 7.6* 7.8* 7.5*   GFR: Estimated Creatinine Clearance: 74.1 mL/min (by C-G formula based on Cr of 0.43). Liver Function Tests: No results for input(s): AST, ALT, ALKPHOS, BILITOT, PROT, ALBUMIN in the last 168 hours. No results for input(s): LIPASE, AMYLASE in the last 168 hours. No results for input(s): AMMONIA in the last 168 hours. Coagulation Profile: No results for input(s): INR, PROTIME in the last 168 hours. Cardiac Enzymes: No results for input(s): CKTOTAL, CKMB, CKMBINDEX, TROPONINI in the last 168 hours. BNP (last 3 results) No results for input(s): PROBNP in the last 8760 hours. HbA1C: No results for input(s): HGBA1C in the last 72 hours. CBG: No results for input(s): GLUCAP in the last 168 hours. Lipid Profile: No results for input(s): CHOL, HDL, LDLCALC,  TRIG, CHOLHDL, LDLDIRECT in the last 72 hours. Thyroid Function Tests: No results for input(s): TSH, T4TOTAL, FREET4, T3FREE, THYROIDAB in the last 72 hours. Anemia Panel: No results for input(s): VITAMINB12, FOLATE, FERRITIN, TIBC, IRON, RETICCTPCT in the last 72 hours. Urine analysis: No results found for: COLORURINE, APPEARANCEUR, LABSPEC, PHURINE, GLUCOSEU, HGBUR, BILIRUBINUR, KETONESUR, PROTEINUR, UROBILINOGEN, NITRITE, LEUKOCYTESUR Sepsis Labs: '@LABRCNTIP'$ (procalcitonin:4,lacticidven:4)  )No results found for this or any previous visit (from the past 240 hour(s)).    Studies: No results found.  Scheduled Meds: . folic acid  1 mg Oral Daily  . lisinopril  20 mg Oral Daily  . pantoprazole  40 mg Intravenous Q12H    Continuous Infusions: . sodium chloride 75 mL/hr at 12/31/15 0100  LOS: 8 days   Time spent: 35 minutes  Elmarie Shiley, MD [facility] Pager [phone]  If 7PM-7AM, please contact night-coverage [email or web address] Password [email or web address] 12/31/2015, 8:24 AM

## 2015-12-31 NOTE — Discharge Summary (Signed)
Physician Discharge Summary  David Warren SPQ:[id number] DOB: [birth year] DOA: 12/23/2015  PCP: Maren Reamer, MD  Admit date: 12/23/2015 Discharge date: 12/31/2015  Admitted From: Home  Disposition:  Discharge home with hospice.   Recommendations for Outpatient Follow-up:  1. Discharge home with hospice./    Discharge Condition: guarded, hospice CODE STATUS: DNR Diet recommendation: Full liquid diet   Brief/Interim Summary: Acute blood loss anemia with underlying Iron deficiency anemia: secondary to multiples  Metastases small bowel. : -Status post multiple transfusions 9 to 10 units. . continue keeping hemoglobin levels above 8. -IV iron 1 given. -tagged red blood cell scan: active small bowel bleed.  -Per surgery patient is not a candidate for surgery.  -received one unit PRBC 7-05. Plan to get 2 more units.   Long discussion with patient and family. Explain to them limitation on Mr Nestle care. He is not a candidate for surgery. No others options to stop bleeding. Explain to them risk of multiples blood transfusion. They are asking about outpatient transfusion.Palliative care discussed with Dr Julien Nordmann  No further treatments recommended.  I discussed Code status, Mr Sollenberger does not want to be resuscitated.   I came back to speak with patient and family, recommendation for residential hospice. Mr Calvillo wants to go home, he does not wants to go to residential hospice. Mr Carrick  understand the risk of bleeding and his condition,he does not want to stay in thehospital. Explain to family that multiples bloods transfusion has also risk, we wont be able to do transfusion at cancer center, they can always go to ED for evaluation.    Adenocarcinoma of left lung, stage 4 [facility]): oncology comments that if his small bowel metastases are causing this bleeding there are not really good treatment options. If he has localized bowel metastases possible surgical resection may help. See  above   Discharge Diagnoses:    Acute blood loss anemia with underlying Iron deficiency anemia, secondary to multiples metastasis to small bowel.   Iron deficiency anemia,    Adenocarcinoma of left lung, stage 4 (HCC)   Acute blood loss anemia   Melena    Discharge Instructions  Discharge Instructions    Increase activity slowly    Complete by:  As directed             Medication List    STOP taking these medications        lisinopril-hydrochlorothiazide 20-25 MG tablet  Commonly known as:  PRINZIDE,ZESTORETIC     prochlorperazine 10 MG tablet  Commonly known as:  COMPAZINE      TAKE these medications        acetaminophen 500 MG tablet  Commonly known as:  TYLENOL  Take 650 mg by mouth every 6 (six) hours as needed.     folic acid 1 MG tablet  Commonly known as:  FOLVITE  Take 1 tablet (1 mg total) by mouth daily.     ondansetron 4 MG tablet  Commonly known as:  ZOFRAN  Take 1 tablet (4 mg total) by mouth every 6 (six) hours as needed for nausea.     oxyCODONE-acetaminophen 5-325 MG tablet  Commonly known as:  PERCOCET  Take 1-2 tablets by mouth every 4 (four) hours as needed for severe pain.     pantoprazole 40 MG tablet  Commonly known as:  PROTONIX  Take 1 tablet (40 mg total) by mouth 2 (two) times daily.        Allergies  Allergen Reactions  .  Bee Venom Anaphylaxis    Consultations:  Palliative care, oncology, surgery , GI    Procedures/Studies: Nm Gi Blood Loss  12/29/2015  CLINICAL DATA:  Evaluate GI bleed EXAM: NUCLEAR MEDICINE GASTROINTESTINAL BLEEDING SCAN TECHNIQUE: Sequential abdominal images were obtained following intravenous administration of Tc-28mlabeled red blood cells. RADIOPHARMACEUTICALS:  24.0 mCi Tc-956mn-vitro labeled red cells. COMPARISON:  None. FINDINGS: Dynamic imaging was performed for 120 minutes. The specific imaging features of active large bowel bleeding is not visualized. More apparent on the second hour are areas  of increased radiotracer uptake within both sides of the abdomen which follow the expected course of the small bowel. IMPRESSION: Imaging findings favor a active small bowel bleed. On the study from 12/25/2015 the patient was noted to have multifocal small bowel lesions which may reflect metastatic disease from patient's stage IV adenocarcinoma of the lung. Electronically Signed   By: TaKerby Moors.D.   On: 12/29/2015 19:05   Ct Abdomen Pelvis W Contrast  12/25/2015  CLINICAL DATA:  Mid to lower abdominal pain. History of lung cancer and GI bleed. Anemia. Blood in stools. EXAM: CT ABDOMEN AND PELVIS WITH CONTRAST TECHNIQUE: Multidetector CT imaging of the abdomen and pelvis was performed using the standard protocol following bolus administration of intravenous contrast. CONTRAST:  10074mSOVUE-300 IOPAMIDOL (ISOVUE-300) INJECTION 61% COMPARISON:  PET-CT 11/20/2015 FINDINGS: Lower chest: Stable 4 mm right middle lobe pulmonary nodule. No other pulmonary lesions. There are very small pleural effusions and overlying atelectasis. Hepatobiliary: No focal hepatic lesions to suggest metastatic disease. The gallbladder is partially contracted. A small gallstone is noted. No common bile duct dilatation. Pancreas: No mass, inflammation or ductal dilatation. Spleen: Linear defect in the upper aspect of the spleen is most likely a this is new since the prior PET-CT. Splenic infarct. Adrenals/Urinary Tract: Stable bilateral adrenal gland lesions. The left adrenal gland lesion is likely an adenoma. The right adrenal gland is likely of metastasis based on the prior PET-CT. 2.8 cm enhancing lower pole right renal mass consistent with renal cell carcinoma. The left kidney is normal. Stomach/Bowel: The stomach is unremarkable. No obvious mass or ulcer. The duodenum appears normal. There appear to be numerous soft tissue lesions involving the small bowel and colon suspicious for metastasis. In retrospect I think the patchy [place]  also. There is an acute inflammatory process between the proximal transverse colon and adjacent small bowel loop. This is most likely a a colonic process. It could be acute diverticulitis but more likely a metastatic lesion that bled. Vascular/Lymphatic: Advanced atherosclerotic calcifications involving the aorta. No aneurysm or dissection. The major venous structures are patent. No mesenteric or retroperitoneal mass or adenopathy. Other: Small amount of free pelvic fluid. The bladder, prostate gland and seminal vesicles appear normal. No inguinal mass or adenopathy. Musculoskeletal: No significant bony findings. IMPRESSION: 1. Acute inflammatory process centered around the inferior margin of the proximal transverse colon and an adjacent loop of jejunum. Focal wall thickening of the colon is likely the epicenter and could be due to a metastasis that has hemorrhaged. Diverticulitis is felt to be unlikely. The adjacent small bilobed also appears normal and there could be a metastasis in this area also. Colonoscopy may be helpful for diagnostic purposes. Upper Endoscopy may also be helpful as there are lesions in the third portion the duodenum. 2. Diffuse small bowel lesions suspicious for diffuse metastasis. In retrospect I think these were positive on the PET scan. 3. 2.8 cm enhancing right renal mass most consistent  with renal cell cancer. 4. Small bilateral adrenal gland lesions. The right lesion was hypermetabolic on the recent PET-CT. The left lesion is a benign adenoma. These results were called by telephone at the time of interpretation on 12/25/2015 at 3:51 pm to Dr. Wilford Corner , who verbally acknowledged these results. Electronically Signed   By: Marijo Sanes M.D.   On: 12/25/2015 15:51       Subjective:   Discharge Exam: Filed Vitals:   12/31/15 0532 12/31/15 0840  BP: 150/58 165/57  Pulse: 85 88  Temp: 98.4 F (36.9 C) 98.5 F (36.9 C)  Resp: 18 18   Filed Vitals:   12/30/15 2238  12/31/15 0058 12/31/15 0532 12/31/15 0840  BP: 133/47 132/49 150/58 165/57  Pulse: 87 83 85 88  Temp: 98.7 F (37.1 C) 98.1 F (36.7 C) 98.4 F (36.9 C) 98.5 F (36.9 C)  TempSrc: Oral Oral Oral Oral  Resp: '18 16 18 18  '$ Height:      Weight:      SpO2: 100% 100% 100% 100%    General: Pt is alert, awake, not in acute distress Cardiovascular: RRR, S1/S2 +, no rubs, no gallops Respiratory: CTA bilaterally, no wheezing, no rhonchi Abdominal: Soft, NT, ND, bowel sounds + Extremities: no edema, no cyanosis    The results of significant diagnostics from this hospitalization (including imaging, microbiology, ancillary and laboratory) are listed below for reference.     Microbiology: No results found for this or any previous visit (from the past 240 hour(s)).   Labs: BNP (last 3 results) No results for input(s): BNP in the last 8760 hours. Basic Metabolic Panel:  Recent Labs Lab 12/25/15 0417 12/26/15 0521 12/27/15 0519 12/28/15 0511 12/30/15 0444  NA 130* 132* 133* 134* 133*  K 4.3 4.0 4.0 3.8 4.2  CL 105 106 110 108 105  CO2 21* 20* 20* 21* 22  GLUCOSE 108* 110* 110* 105* 100*  BUN '10 13 14 16 17  '$ CREATININE 0.45* 0.46* 0.33* 0.43* 0.43*  CALCIUM 7.6* 7.5* 7.6* 7.8* 7.5*   Liver Function Tests: No results for input(s): AST, ALT, ALKPHOS, BILITOT, PROT, ALBUMIN in the last 168 hours. No results for input(s): LIPASE, AMYLASE in the last 168 hours. No results for input(s): AMMONIA in the last 168 hours. CBC:  Recent Labs Lab 12/25/15 0417  12/26/15 0521  12/27/15 0519  12/28/15 0511 12/28/15 2015 12/29/15 0759 12/29/15 1858 12/30/15 0444 12/30/15 1739 12/31/15 0734  WBC 14.1*  --  12.4*  --  12.0*  --  14.1*  --   --   --  14.8*  --   --   HGB 6.1*  < > 5.8*  < > 6.9*  < > 7.7* 7.9* 7.0* 9.7* 8.7* 7.7* 8.3*  HCT 19.0*  < > 17.6*  < > 20.4*  < > 22.7* 23.4*  --  28.1* 25.6* 22.6* 24.4*  MCV 85.2  --  85.9  --  86.8  --  87.6  --   --   --  92.4  --   --    PLT 505*  --  442*  --  386  --  344  --   --   --  331  --   --   < > = values in this interval not displayed. Cardiac Enzymes: No results for input(s): CKTOTAL, CKMB, CKMBINDEX, TROPONINI in the last 168 hours. BNP: Invalid input(s): POCBNP CBG: No results for input(s): GLUCAP in the last 168 hours. D-Dimer  No results for input(s): DDIMER in the last 72 hours. Hgb A1c No results for input(s): HGBA1C in the last 72 hours. Lipid Profile No results for input(s): CHOL, HDL, LDLCALC, TRIG, CHOLHDL, LDLDIRECT in the last 72 hours. Thyroid function studies No results for input(s): TSH, T4TOTAL, T3FREE, THYROIDAB in the last 72 hours.  Invalid input(s): FREET3 Anemia work up No results for input(s): VITAMINB12, FOLATE, FERRITIN, TIBC, IRON, RETICCTPCT in the last 72 hours. Urinalysis No results found for: COLORURINE, APPEARANCEUR, LABSPEC, [place], GLUCOSEU, HGBUR, BILIRUBINUR, KETONESUR, PROTEINUR, UROBILINOGEN, NITRITE, LEUKOCYTESUR Sepsis Labs Invalid input(s): PROCALCITONIN,  WBC,  LACTICIDVEN Microbiology No results found for this or any previous visit (from the past 240 hour(s)).   Time coordinating discharge: Over 30 minutes  SIGNED:   Elmarie Shiley, MD  [facility] 12/31/2015, 10:52 AM Pager [phone]  If 7PM-7AM, please contact night-coverage [email or web address] Password [email or web address]

## 2015-12-31 NOTE — Consult Note (Signed)
Consultation Note Date: 12/31/2015   Patient Name: David Warren  DOB: 1955/08/22  MRN: [id number]  Age / Sex: 60 y.o., male  PCP: David Warren Referring Physician: Elmarie Shiley, Warren  Reason for Consultation: Establishing goals of care  HPI/Patient Profile: 59 y.o. male  with past medical history of seizures and David Warren admitted on 12/23/2015 with anemia from the oncology clinic.   After having some episodes of hemoptysis he underwent lung biopsy in May 2017 and was found to have stage 3 adenocarcinoma of the lung.  He saw David Warren of Oncology who completed a PET scan and offered palliative chemo vs pure palliative care.  The patient was planning to start chemotherapy this week but unfortunately developed and active bleed.  On admission he reported having intermittent melena and his legs had become weak.  During this hospitalization an EGD and capsule endoscopy were performed.  Multiple friable lesions were found in the small bowel consistent with metastatic lung cancer.  Surgery was consulted.  As the bleeding does not appear to be coming from a specific area of the small bowel, removal to the entire small bowel would be necessary to stop this bleeding.  This is incompatible with life.    The patient has been requiring daily blood transfusions (1-2 units) in order to maintain a hgb above 8.  Palliative Medicine has been consulted for goals of care and planning.  Clinical Assessment and Goals of Care: I utilized the video interpreter David Warren [phone]) but the patient did not want to speak with the interpreter, he wanted to have his daughter translate.  Consequently his daughter David Warren did the translation.  The patient, David Warren, his wife, his youngest dtr David Warren and his grandson David Warren were present at the meeting.  We discussed the patient's illness.  I explained that cancer metastases had spread quickly and were  bleeding.  The patient expressed that he understood there was no further treatment.  David Warren expressed that she understood that even blood transfusions were limited but thought they could continue outpatient for a period of time.  They asked questions about how the cancer had spread so fast and whether or not Radiation would slow the bleeding.  We discussed end of life.  I conveyed to David Warren that as he lost blood he would become tired and weak, he would eventually become SOB and likely have a heart attack.  I said that Hospice could help with support and medications to minimize pain and anxiety - that death would be made much more comfortable with their involvement.  I strongly recommended residential hospice due to bleeding but the patient insisted that he wanted to go home and be outside in his yard.  I told the family that after the last blood transfusion the patient had only days - a week at most.  After our initial meeting I felt the family still had unanswered questions about blood transfusions.  I called David Warren Warren and checked on further radiation or transfusions.  David Warren recommended no further  treatment or transfusions.  He recommended Hospice services.    David Warren and I went in to see the patient and his family once again to ensure that all of their questions were answered and that they clearly understood his condition and our recommendations.  The patient politely refused Chaplain services.  The patient himself is the primary decision maker.    SUMMARY OF RECOMMENDATIONS   DNR     Symptom Management:   The patient will receive 2 units of PRBCs today and then go home with Hospice Services.  Prognosis:   Hours - Days due to active melena.  He has required 1 - 2 units of blood daily since admission on 6/29  Discharge Planning: Home with Hospice.  Patient will hopefully transfer to residential hospice in the next couple of days.      Primary Diagnoses: Present on  Admission:  . Adenocarcinoma of left lung, stage 4 ([place]) . Iron deficiency anemia . Acute blood loss anemia . Melena  I have reviewed the medical record, interviewed the patient and family, and examined the patient. The following aspects are pertinent.  Past Medical History  Diagnosis Date  . Tobacco abuse   . Lipoma of neck   . Hypertension   . Seizures ([place])     once several years ago  . Adenocarcinoma of left lung, stage 3 ([place]) 11/13/2015   Social History   Social History  . Marital Status: Married    Spouse Name: N/A  . Number of Children: N/A  . Years of Education: N/A   Social History Main Topics  . Smoking status: Former Smoker -- 1.00 packs/day for 35 years    Types: Cigarettes    Quit date: 10/30/2015  . Smokeless tobacco: Never Used  . Alcohol Use: 7.2 oz/week    12 Cans of beer per week     Comment: 2-6 cans beer daily, last drink yesterday  . Drug Use: Yes    Special: Cocaine  . Sexual Activity: Yes   Other Topics Concern  . None   Social History Narrative   ** Merged History Encounter **       Family History  Problem Relation Age of Onset  . Family history unknown: Yes   Scheduled Meds: . folic acid  1 mg Oral Daily  . lisinopril  20 mg Oral Daily  . pantoprazole  40 mg Intravenous Q12H   Continuous Infusions: . sodium chloride 75 mL/hr at 12/31/15 0100   PRN Meds:.acetaminophen, diatrizoate meglumine-sodium, ondansetron **OR** ondansetron (ZOFRAN) IV, senna-docusate, simethicone  Allergies  Allergen Reactions  . Bee Venom Anaphylaxis   Review of SystemsPatient reports mild abdominal pain and melena, but denies other symptoms.    Physical Exam  Well developed male, Alert, orientated and walking to / from the bathroom Neck swelling is evident. Resp NAD Extremities no edema.  Vital Signs: BP 150/58 mmHg  Pulse 85  Temp(Src) 98.4 F (36.9 C) (Oral)  Resp 18  Ht '5\' 3"'$  (1.6 m)  Wt 53.978 kg (119 lb)  BMI 21.09 kg/m2  SpO2  100% Pain Assessment: 0-10   Pain Score: 0-No pain   SpO2: SpO2: 100 % O2 Device:SpO2: 100 % O2 Flow Rate: .   IO: Intake/output summary:  Intake/Output Summary (Last 24 hours) at 12/31/15 0803 Last data filed at 12/31/15 0602  Gross per 24 hour  Intake 1647.92 ml  Output      0 ml  Net 1647.92 ml    LBM: Last BM Date:  12/30/15 Baseline Weight: Weight: 53.978 kg (119 lb) Most recent weight: Weight: 53.978 kg (119 lb)     Palliative Assessment/Data:     Time In: 9:00 Time Out: 10:50 Time Total: 110 Greater than 50%  of this time was spent counseling and coordinating care related to the above assessment and plan.  Signed by: Reuben Likes, PA-C Palliative Medicine Pager: [phone]   Please contact Palliative Medicine Team phone at [phone] for questions and concerns.  For individual provider: See Shea Evans

## 2015-12-31 NOTE — Progress Notes (Signed)
Notified by Marisue Ivan of family request for [facility] services at home after discharge. Chart and patient information currently under review to confirm hospice eligibility.   Spoke with daughter, Kathlee Nations at bedside to initiate education related to hospice philosophy, services and team approach to care. Family verbalized understanding of the information provided. Per discussion, plan is for discharge to home by personal vehicle with daughter today after receiving 2 units of blood.  Please send signed completed DNR form home with patient.  Patient will need prescriptions for discharge comfort medications.   DME needs discussed and family denied any needs at this time.  [facility] aware of the above.  Completed discharge summary will need to be faxed to [facility] at [phone] when final.  Please notify [facility] when patient is ready to leave unit at discharge-call [phone].   [facility] information and contact numbers have been given to [place] during visit.    Please call with any questions.  Thank You,  Freddi Starr RN, [facility] Liaison  [phone]

## 2015-12-31 NOTE — Telephone Encounter (Signed)
David Warren from [facility] called back, patient will be d/c from hospital and patient would like PCP to be attending of record. Rep would like to know if PCP accepts hospice standing orders.  She needs to know b/c they will not be able to see the pt. Unless the PCP approves. Please f/up

## 2015-12-31 NOTE — Telephone Encounter (Signed)
Katharine Look from [facility] called, patient will be d/c from hospital and patient would like PCP to be attending of record. Rep would like to know if PCP accepts hospice standing orders. Please f/up

## 2015-12-31 NOTE — Progress Notes (Signed)
2 units of PRBC transfused. IV's removed and pt discharged per MD orders after transfusion complete. Pt stable at time of discharge.    David Warren [facility] 12/31/2015 12/31/2015

## 2015-12-31 NOTE — Progress Notes (Signed)
Patient ingested capsule at 0902. [facility]

## 2016-01-01 LAB — TYPE AND SCREEN
ABO/RH(D): AB POS
ABO/RH(D): AB POS
ANTIBODY SCREEN: NEGATIVE
Antibody Screen: NEGATIVE
UNIT DIVISION: 0
UNIT DIVISION: 0
UNIT DIVISION: 0
Unit division: 0
Unit division: 0
Unit division: 0
Unit division: 0
Unit division: 0

## 2016-01-01 NOTE — Telephone Encounter (Signed)
Will forward request to PCP

## 2016-01-04 ENCOUNTER — Encounter (HOSPITAL_COMMUNITY): Payer: Self-pay | Admitting: Emergency Medicine

## 2016-01-04 ENCOUNTER — Observation Stay (HOSPITAL_COMMUNITY)
Admission: EM | Admit: 2016-01-04 | Discharge: 2016-01-05 | Disposition: A | Discharge status: Home / Self Care | Payer: No Typology Code available for payment source | Attending: Internal Medicine | Admitting: Internal Medicine

## 2016-01-04 DIAGNOSIS — I2510 Atherosclerotic heart disease of native coronary artery without angina pectoris: Secondary | ICD-10-CM | POA: Diagnosis present

## 2016-01-04 DIAGNOSIS — D62 Acute posthemorrhagic anemia: Secondary | ICD-10-CM | POA: Insufficient documentation

## 2016-01-04 DIAGNOSIS — K921 Melena: Principal | ICD-10-CM | POA: Insufficient documentation

## 2016-01-04 DIAGNOSIS — Z66 Do not resuscitate: Secondary | ICD-10-CM | POA: Insufficient documentation

## 2016-01-04 DIAGNOSIS — D649 Anemia, unspecified: Secondary | ICD-10-CM | POA: Diagnosis present

## 2016-01-04 DIAGNOSIS — D72829 Elevated white blood cell count, unspecified: Secondary | ICD-10-CM | POA: Diagnosis present

## 2016-01-04 DIAGNOSIS — I10 Essential (primary) hypertension: Secondary | ICD-10-CM | POA: Insufficient documentation

## 2016-01-04 DIAGNOSIS — Z515 Encounter for palliative care: Secondary | ICD-10-CM | POA: Insufficient documentation

## 2016-01-04 DIAGNOSIS — C784 Secondary malignant neoplasm of small intestine: Secondary | ICD-10-CM | POA: Insufficient documentation

## 2016-01-04 DIAGNOSIS — Z87891 Personal history of nicotine dependence: Secondary | ICD-10-CM | POA: Insufficient documentation

## 2016-01-04 DIAGNOSIS — K922 Gastrointestinal hemorrhage, unspecified: Secondary | ICD-10-CM | POA: Insufficient documentation

## 2016-01-04 DIAGNOSIS — C3492 Malignant neoplasm of unspecified part of left bronchus or lung: Secondary | ICD-10-CM | POA: Diagnosis present

## 2016-01-04 LAB — URINALYSIS, ROUTINE W REFLEX MICROSCOPIC
Bilirubin Urine: NEGATIVE
Glucose, UA: NEGATIVE mg/dL
Hgb urine dipstick: NEGATIVE
KETONES UR: NEGATIVE mg/dL
LEUKOCYTES UA: NEGATIVE
Nitrite: NEGATIVE
PROTEIN: NEGATIVE mg/dL
Specific Gravity, Urine: 1.019 (ref 1.005–1.030)
pH: 5.5 (ref 5.0–8.0)

## 2016-01-04 LAB — CBC
HEMATOCRIT: 14.7 % — AB (ref 39.0–52.0)
Hemoglobin: 5 g/dL — CL (ref 13.0–17.0)
MCH: 30.9 pg (ref 26.0–34.0)
MCHC: 34 g/dL (ref 30.0–36.0)
MCV: 90.7 fL (ref 78.0–100.0)
PLATELETS: 651 10*3/uL — AB (ref 150–400)
RBC: 1.62 MIL/uL — ABNORMAL LOW (ref 4.22–5.81)
RDW: 15.2 % (ref 11.5–15.5)
WBC: 20.6 10*3/uL — AB (ref 4.0–10.5)

## 2016-01-04 LAB — BASIC METABOLIC PANEL
Anion gap: 11 (ref 5–15)
BUN: 26 mg/dL — AB (ref 6–20)
CALCIUM: 7.6 mg/dL — AB (ref 8.9–10.3)
CO2: 20 mmol/L — ABNORMAL LOW (ref 22–32)
CREATININE: 0.63 mg/dL (ref 0.61–1.24)
Chloride: 99 mmol/L — ABNORMAL LOW (ref 101–111)
GFR calc Af Amer: >60 mL/min (ref >60)
Glucose, Bld: 112 mg/dL — ABNORMAL HIGH (ref 65–99)
POTASSIUM: 4.1 mmol/L (ref 3.5–5.1)
SODIUM: 130 mmol/L — AB (ref 135–145)

## 2016-01-04 LAB — PREPARE RBC (CROSSMATCH)

## 2016-01-04 MED ORDER — SODIUM CHLORIDE 0.9 % IV BOLUS (SEPSIS)
500.0000 mL | Freq: Once | INTRAVENOUS | Status: AC
  Administered 2016-01-04: 500 mL via INTRAVENOUS

## 2016-01-04 MED ORDER — ONDANSETRON HCL 4 MG/2ML IJ SOLN
4.0000 mg | Freq: Once | INTRAMUSCULAR | Status: AC
  Administered 2016-01-04: 4 mg via INTRAVENOUS
  Filled 2016-01-04: qty 2

## 2016-01-04 MED ORDER — FENTANYL CITRATE (PF) 100 MCG/2ML IJ SOLN
50.0000 ug | Freq: Once | INTRAMUSCULAR | Status: AC
  Administered 2016-01-04: 50 ug via INTRAVENOUS
  Filled 2016-01-04: qty 2

## 2016-01-04 MED ORDER — SODIUM CHLORIDE 0.9 % IV SOLN
10.0000 mL/h | Freq: Once | INTRAVENOUS | Status: AC
Start: 2016-01-04 — End: 2016-01-04
  Administered 2016-01-04: 10 mL/h via INTRAVENOUS

## 2016-01-04 MED ORDER — FAMOTIDINE IN NACL 20-0.9 MG/50ML-% IV SOLN
20.0000 mg | Freq: Two times a day (BID) | INTRAVENOUS | Status: DC
  Administered 2016-01-04 – 2016-01-05 (×2): 20 mg via INTRAVENOUS
  Filled 2016-01-04 (×2): qty 50

## 2016-01-04 MED ORDER — LORAZEPAM 2 MG/ML IJ SOLN
0.5000 mg | INTRAMUSCULAR | Status: DC | PRN
  Administered 2016-01-05: 0.5 mg via INTRAVENOUS
  Filled 2016-01-04: qty 1

## 2016-01-04 MED ORDER — ONDANSETRON HCL 4 MG/2ML IJ SOLN: 4.0000 mg | Freq: Four times a day (QID) | INTRAMUSCULAR | Status: DC | PRN

## 2016-01-04 MED ORDER — HYDROMORPHONE HCL 1 MG/ML IJ SOLN
1.0000 mg | INTRAMUSCULAR | Status: DC | PRN
  Administered 2016-01-05 (×2): 1 mg via INTRAVENOUS
  Filled 2016-01-04 (×2): qty 1

## 2016-01-04 MED ORDER — SODIUM CHLORIDE 0.9 % IV SOLN
INTRAVENOUS | Status: DC
  Administered 2016-01-04 – 2016-01-05 (×2): via INTRAVENOUS

## 2016-01-04 MED ORDER — ONDANSETRON HCL 4 MG PO TABS: 4.0000 mg | ORAL_TABLET | Freq: Four times a day (QID) | ORAL | Status: DC | PRN

## 2016-01-04 NOTE — ED Notes (Addendum)
Attempted [place] IV without success. Type and Screen obtained Oneal Grout RN requested for [place] IV

## 2016-01-04 NOTE — ED Notes (Signed)
Bed: HN88 Expected date:  Expected time:  Means of arrival:  Comments: EMS 60 yo fall

## 2016-01-04 NOTE — Telephone Encounter (Signed)
Yes I will accept hospice standing orders and be PCP of attending record.  Called and talked to [place] w/ hospice, home hospice

## 2016-01-04 NOTE — H&P (Signed)
History and Physical    David Warren QAS:[id number] DOB: 01/28/1956 DOA: 01/04/2016  PCP: Maren Reamer, MD   Patient coming from: Home.  Chief Complaint: Dizziness.  HPI: David Warren is a 60 y.o. male with medical history significant of metastatic lung cancer to the abdomen/bowel, hypertension, seizure disorder, tobacco use disorder, history of single seizure episode years ago who was recently admitted (12/23/2015) and discharged (12/31/2015) on hospice due to GI bleed and returns to the hospital due to dizziness.  Last month on the 29th, the patient underwent an EGD which show an non bleeding gastric ulcer, but was otherwise negative for a source of bleeding. A tagged red blood cell scan showed an active small bowel bleed. He subsequently underwent a capsule endoscopy which showed multiple metastasis on the duodenum and jejunum which apparently are the source of bleeding.   ED Course: CBC shows a hemoglobin level of 5 g/dL and earlier physical exam by the ED medical staff showed melanotic stools. He is being admitted for blood transfusions and palliative measures.  Review of Systems: As per HPI otherwise 10 point review of systems negative.   Past Medical History  Diagnosis Date  . Tobacco abuse   . Lipoma of neck   . Hypertension   . Seizures ([place])     once several years ago  . Adenocarcinoma of left lung, stage 3 ([place]) 11/13/2015    Past Surgical History  Procedure Laterality Date  . No past surgeries    . Electromagnetic navigation brochoscopy N/A 11/05/2015    Procedure: ELECTROMAGNETIC NAVIGATION BRONCHOSCOPY;  Surgeon: Flora Lipps, MD;  Location: [facility] ORS;  Service: Cardiopulmonary;  Laterality: N/A;  . Endobronchial ultrasound N/A 11/05/2015    Procedure: ENDOBRONCHIAL ULTRASOUND;  Surgeon: Flora Lipps, MD;  Location: [facility] ORS;  Service: Cardiopulmonary;  Laterality: N/A;  . Esophagogastroduodenoscopy (egd) with propofol N/A 12/24/2015    Procedure:  ESOPHAGOGASTRODUODENOSCOPY (EGD) WITH PROPOFOL;  Surgeon: Wilford Corner, MD;  Location: [facility] ENDOSCOPY;  Service: Endoscopy;  Laterality: N/A;  . Givens capsule study N/A 12/27/2015    Procedure: GIVENS CAPSULE STUDY;  Surgeon: Wilford Corner, MD;  Location: [facility] ENDOSCOPY;  Service: Endoscopy;  Laterality: N/A;     reports that he quit smoking about 2 months ago. His smoking use included Cigarettes. He has a 35 pack-year smoking history. He has never used smokeless tobacco. He reports that he drinks about 7.2 oz of alcohol per week. He reports that he uses illicit drugs (Cocaine).  Allergies  Allergen Reactions  . Bee Venom Anaphylaxis    Family History  Problem Relation Age of Onset  . Family history unknown: Yes     Prior to Admission medications   Medication Sig Start Date End Date Taking? Authorizing Provider  Docusate Calcium (STOOL SOFTENER PO) Take by mouth daily as needed (constipation.).   Yes Historical Provider, MD  ondansetron (ZOFRAN) 4 MG tablet Take 1 tablet (4 mg total) by mouth every 6 (six) hours as needed for nausea. 12/31/15  Yes Belkys A Regalado, MD  oxyCODONE-acetaminophen (PERCOCET) 5-325 MG tablet Take 1-2 tablets by mouth every 4 (four) hours as needed for severe pain. 12/31/15  Yes Belkys A Regalado, MD  pantoprazole (PROTONIX) 40 MG tablet Take 1 tablet (40 mg total) by mouth 2 (two) times daily. 12/31/15  Yes Belkys A Regalado, MD  folic acid (FOLVITE) 1 MG tablet Take 1 tablet (1 mg total) by mouth daily. Patient not taking: Reported on 01/04/2016 12/05/15   Curt Bears, MD  LORazepam (  ATIVAN) 0.5 MG tablet Take 0.5 mg by mouth every 6 (six) hours as needed for anxiety.    Historical Provider, MD    Physical Exam: Filed Vitals:   01/04/16 2055 01/04/16 2100 01/04/16 2138 01/04/16 2217  BP: 126/43 115/43 99/49 118/46  Pulse: 110 106 109 109  Temp:    98.9 F (37.2 C)  TempSrc:    Oral  Resp: '10 15 19 18  '$ Height:    '5\' 4"'$  (1.626 m)  Weight:    54.749 kg  (120 lb 11.2 oz)  SpO2: 100% 100% 100% 100%      Constitutional: Looks cachectic and chronically ill, but in NAD. Filed Vitals:   01/04/16 2055 01/04/16 2100 01/04/16 2138 01/04/16 2217  BP: 126/43 115/43 99/49 118/46  Pulse: 110 106 109 109  Temp:    98.9 F (37.2 C)  TempSrc:    Oral  Resp: '10 15 19 18  '$ Height:    '5\' 4"'$  (1.626 m)  Weight:    54.749 kg (120 lb 11.2 oz)  SpO2: 100% 100% 100% 100%   Eyes: PERRL, lids and conjunctivae normal ENMT: Mucous membranes are dry. Posterior pharynx clear of any exudate or lesions. Dentition looks stained and in poor state of repair.  Neck: Large sized lipoma and cervical spine area. Respiratory: clear to auscultation bilaterally, no wheezing, no crackles. Normal respiratory effort. No accessory muscle use.  Cardiovascular: Tachycardic with regular rhythm at 108 bpm, no murmurs / rubs / gallops. No extremity edema. 2+ pedal pulses. No carotid bruits.  Abdomen: no tenderness, no masses palpated. No hepatosplenomegaly. Bowel sounds positive.  Musculoskeletal: no clubbing / cyanosis. No joint deformity upper and lower extremities. Good ROM, no contractures. Normal muscle tone.  Skin: no rashes, lesions, ulcers on limited exam. Neurologic: CN 2-12 grossly intact. Sensation intact, DTR normal. Strength 5/5 in all 4.  Psychiatric: Normal judgment and insight. Alert and oriented x 4. Normal mood.    Labs on Admission: I have personally reviewed following labs and imaging studies  CBC:  Recent Labs Lab 12/29/15 1858 12/30/15 0444 12/30/15 1739 12/31/15 0734 01/04/16 1823  WBC  --  14.8*  --   --  20.6*  HGB 9.7* 8.7* 7.7* 8.3* 5.0*  HCT 28.1* 25.6* 22.6* 24.4* 14.7*  MCV  --  92.4  --   --  90.7  PLT  --  331  --   --  563*   Basic Metabolic Panel:  Recent Labs Lab 12/30/15 0444 01/04/16 1823  NA 133* 130*  K 4.2 4.1  CL 105 99*  CO2 22 20*  GLUCOSE 100* 112*  BUN 17 26*  CREATININE 0.43* 0.63  CALCIUM 7.5* 7.6*    GFR: Estimated Creatinine Clearance: 75 mL/min (by C-G formula based on Cr of 0.63).  Urine analysis:    Component Value Date/Time   COLORURINE YELLOW 01/04/2016 1948   APPEARANCEUR CLEAR 01/04/2016 1948   LABSPEC 1.019 01/04/2016 1948   PHURINE 5.5 01/04/2016 1948   GLUCOSEU NEGATIVE 01/04/2016 1948   HGBUR NEGATIVE 01/04/2016 1948   BILIRUBINUR NEGATIVE 01/04/2016 1948   KETONESUR NEGATIVE 01/04/2016 1948   PROTEINUR NEGATIVE 01/04/2016 1948   NITRITE NEGATIVE 01/04/2016 1948   LEUKOCYTESUR NEGATIVE 01/04/2016 1948   EKG: Independently reviewed. Vent. rate 102 BPM PR interval * ms QRS duration 74 ms QT/QTc 343/447 ms P-R-T axes 69 49 60 Sinus tachycardia Probable left atrial enlargement Minimal ST depression, lateral leads   Assessment/Plan Principal Problem:   Symptomatic anemia Admit to  MedSurg/observation. Transfuse 2 units of packed RBCs. Follow-up CBC in the morning. Continue palliative care measures. The patient may or may not benefit from iron supplementation.  Active Problems:   Melena    Upper GI bleed Seen by GI recently. EGD showed nonbleeding gastric ulcer. Colonoscopy was deferred.  Capsule endoscopy showed multiple small bowel metastasis. Transfuse as needed and reconsult GI if clinically beneficial.    Adenocarcinoma of left lung, stage 4 (H[facility]) Continue analgesics, anxiolytics and palliative care measures. The patient is DO NOT RESUSCITATE/DO NOT INTUBATE.    CAD (coronary artery disease) Denies chest pain at this time. Not a candidate for invasive procedures or even antiplatelet therapy at this time.    Leukocytosis Last WBC was 14. No fever or signs of infection at this time. Follow-up CBC in the morning.   DVT prophylaxis: SCDs. Code Status: DNR/DNI Family Communication:  Disposition Plan: Admit for blood transfusion and discharge home to hospice care. Consults called:  Admission status: Observation/MedSurg.   Reubin Milan MD [facility] Pager [phone].  If 7PM-7AM, please contact night-coverage [email or web address] Password [email or web address]  01/04/2016, 10:22 PM

## 2016-01-04 NOTE — ED Notes (Signed)
Per pt daughter, pt c/o dizziness since this morning. Pt dizziness increases with movement. Hx lung cancer. Per daughter, pt admitted last week for low hemoglobin. Pts daughter, states that patient is on hospice care.

## 2016-01-04 NOTE — ED Provider Notes (Signed)
Emergency Department Provider Note  Time seen: Approximately 6:34 PM  I have reviewed the triage vital signs and the nursing notes.   HISTORY  Chief Complaint Dizziness  HPI David Warren is a 60 y.o. male with PMH of adenocarcinoma currently on hospice with multiple metastatic lesions to the small bowel and resulting ongoing GI bleeding and anemia s/p admission with multiple transfusions presents to the emergency department for evaluation of lightheadedness on standing up. Family at bedside state that the patient's black stools have continued x 1 today. They also noted a small degree of hemoptysis. The patient is complaining of baseline abdominal discomfort. No fevers or shaking chills. Patient is currently on hospice and wishes to stay that way. Family states that they were told he is not a candidate for outpatient transfusions because of the risk of multiple transfusions and that the patient is not a candidate for surgical correction.   The patient denies any chest pain or difficulty breathing. No headache. No weakness in the arms or legs. Family report pain in the hands and feet. He has been taking home medications at home as scheduled.    Past Medical History  Diagnosis Date  . Tobacco abuse   . Lipoma of neck   . Hypertension   . Seizures ([place])     once several years ago  . Adenocarcinoma of left lung, stage 3 ([place]) 11/13/2015    Patient Active Problem List   Diagnosis Date Noted  . Palliative care encounter   . Goals of care, counseling/discussion   . Encounter for hospice care discussion   . Melena 12/24/2015  . Iron deficiency anemia 12/23/2015  . Acute blood loss anemia 12/23/2015  . Chronic fatigue 12/05/2015  . Adenocarcinoma of left lung, stage 4 ([place]) 11/13/2015  . Adenopathy   . Lung mass   . Preop cardiovascular exam 11/03/2015  . Aortic atherosclerosis ([place]) 11/03/2015  . CAD (coronary artery disease) 11/03/2015  . Tobacco abuse 10/23/2015  . Pulmonary  nodules/lesions, multiple 10/23/2015  . Hemoptysis 10/23/2015    Past Surgical History  Procedure Laterality Date  . No past surgeries    . Electromagnetic navigation brochoscopy N/A 11/05/2015    Procedure: ELECTROMAGNETIC NAVIGATION BRONCHOSCOPY;  Surgeon: Flora Lipps, MD;  Location: [facility] ORS;  Service: Cardiopulmonary;  Laterality: N/A;  . Endobronchial ultrasound N/A 11/05/2015    Procedure: ENDOBRONCHIAL ULTRASOUND;  Surgeon: Flora Lipps, MD;  Location: [facility] ORS;  Service: Cardiopulmonary;  Laterality: N/A;  . Esophagogastroduodenoscopy (egd) with propofol N/A 12/24/2015    Procedure: ESOPHAGOGASTRODUODENOSCOPY (EGD) WITH PROPOFOL;  Surgeon: Wilford Corner, MD;  Location: [facility];  Service: Endoscopy;  Laterality: N/A;  . Givens capsule study N/A 12/27/2015    Procedure: GIVENS CAPSULE STUDY;  Surgeon: Wilford Corner, MD;  Location: [facility];  Service: Endoscopy;  Laterality: N/A;    Current Outpatient Rx  Name  Route  Sig  Dispense  Refill  . acetaminophen (TYLENOL) 500 MG tablet   Oral   Take 650 mg by mouth every 6 (six) hours as needed.         . folic acid (FOLVITE) 1 MG tablet   Oral   Take 1 tablet (1 mg total) by mouth daily.   30 tablet   4   . ondansetron (ZOFRAN) 4 MG tablet   Oral   Take 1 tablet (4 mg total) by mouth every 6 (six) hours as needed for nausea.   20 tablet   0   . oxyCODONE-acetaminophen (PERCOCET) 5-325 MG  tablet   Oral   Take 1-2 tablets by mouth every 4 (four) hours as needed for severe pain.   10 tablet   0   . pantoprazole (PROTONIX) 40 MG tablet   Oral   Take 1 tablet (40 mg total) by mouth 2 (two) times daily.   30 tablet   0     Allergies Bee venom  Family History  Problem Relation Age of Onset  . Family history unknown: Yes    Social History Social History  Substance Use Topics  . Smoking status: Former Smoker -- 1.00 packs/day for 35 years    Types: Cigarettes    Quit date: 10/30/2015  . Smokeless  tobacco: Never Used  . Alcohol Use: 7.2 oz/week    12 Cans of beer per week     Comment: 2-6 cans beer daily, last drink yesterday    Review of Systems  Constitutional: No fever/chills. Positive lightheadedness.  Eyes: No visual changes. ENT: No sore throat. Cardiovascular: Denies chest pain. Respiratory: Denies shortness of breath. Gastrointestinal: Positive abdominal pain.  No nausea, no vomiting.  No diarrhea.  No constipation. Genitourinary: Negative for dysuria. Musculoskeletal: Negative for back pain. Skin: Negative for rash. Neurological: Negative for headaches, focal weakness or numbness.  10-point ROS otherwise negative.  ____________________________________________   PHYSICAL EXAM:  VITAL SIGNS: ED Triage Vitals  Enc Vitals Group     BP 01/04/16 1556 108/48 mmHg     Pulse Rate 01/04/16 1556 117     Resp 01/04/16 1556 18     Temp 01/04/16 1556 98.7 F (37.1 C)     Temp Source 01/04/16 1556 Oral     SpO2 01/04/16 1556 95 %     Pain Score 01/04/16 1419 7    Constitutional: Alert and oriented. Well appearing and in no acute distress. Chronically ill-appearing.  Eyes: Conjunctivae are normal. PERRL.  Head: Atraumatic. Nose: No congestion/rhinnorhea. Mouth/Throat: Mucous membranes are dry.  Oropharynx non-erythematous. Neck: No stridor.  Cardiovascular: Tachycardia. Good peripheral circulation. Grossly normal heart sounds.   Respiratory: Normal respiratory effort.  No retractions. Lungs CTAB. Gastrointestinal: Soft and nontender. No distention. Dark black stool. No gross blood.  Musculoskeletal: No lower extremity tenderness nor edema. No gross deformities of extremities. Neurologic:  Normal speech and language. No gross focal neurologic deficits are appreciated.  Skin:  Skin is warm, dry and intact. No rash noted. Psychiatric: Mood and affect are normal. Speech and behavior are normal.  ____________________________________________   LABS (all labs ordered  are listed, but only abnormal results are displayed)  Labs Reviewed  BASIC METABOLIC PANEL - Abnormal; Notable for the following:    Sodium 130 (*)    Chloride 99 (*)    CO2 20 (*)    Glucose, Bld 112 (*)    BUN 26 (*)    Calcium 7.6 (*)    All other components within normal limits  CBC - Abnormal; Notable for the following:    WBC 20.6 (*)    RBC 1.62 (*)    Hemoglobin 5.0 (*)    HCT 14.7 (*)    Platelets 651 (*)    All other components within normal limits  URINALYSIS, ROUTINE W REFLEX MICROSCOPIC (NOT AT [facility])  COMPREHENSIVE METABOLIC PANEL  CBC WITH DIFFERENTIAL/PLATELET  TYPE AND SCREEN  PREPARE RBC (CROSSMATCH)   ___________________________________________  RADIOLOGY  None ____________________________________________   PROCEDURES  Procedure(s) performed:   Procedures  None ____________________________________________   INITIAL IMPRESSION / ASSESSMENT AND PLAN / ED COURSE  Pertinent labs & imaging results that were available during my care of the patient were reviewed by me and considered in my medical decision making (see chart for details).  Patient presents to the emergency department for evaluation of lightheadedness on standing. Patient has a known chronic GI bleed from multiple metastatic lesions in his abdomen. He is currently on hospice living at home with his daughter. On review of the patient's last admission on 7/6 he is not a surgical candidate for removal of these lesions. They provided him multiple blood transfusions but notes that he is not a candidate for outpatient blood transfusions. Family presents because they were told they could come to the ED for evaluation as needed. Suspected underlying bleed as the cause of the patient's symptoms. He has no other features of arrhythmia or ACS related symptoms. Plan for repeat H/H for assessment of possible transfusion need.   08:04 PM Updated patient and family regarding his hemoglobin level today. Is  much lower than before. Tried to assess goals of care in the room and the family and patient are adamant that he wishes to receive additional blood transfusions. When receiving the blood he feels much better. They realize that at the rate he is losing blood giving multiple blood transfusions every week is not likely to happen. There seems to be some difficulty in the room regarding the patient's hospice care and how that relates to multiple blood transfusions. I think at this time the patient would benefit from an admission for further goals of care discussion and transfusion in the interim until a plan can be developed.   Discussed patient's case with Oncology and Hospitalist, Dr. Olevia Bowens.  Recommend admission. He will place orders. Patient and family (if present) updated with plan. Care transferred to hospitalist service.  I reviewed all nursing notes, vitals, pertinent old records, EKGs, labs, imaging (as available).   ____________________________________________  FINAL CLINICAL IMPRESSION(S) / ED DIAGNOSES  Final diagnoses:  Melena  Adenocarcinoma of left lung, stage 4 ([facility])     MEDICATIONS GIVEN DURING THIS VISIT:  Medications  0.9 %  sodium chloride infusion ( Intravenous New Bag/Given 01/04/16 2318)  ondansetron (ZOFRAN) tablet 4 mg (not administered)    Or  ondansetron (ZOFRAN) injection 4 mg (not administered)  HYDROmorphone (DILAUDID) injection 1 mg (not administered)  famotidine (PEPCID) IVPB 20 mg premix (20 mg Intravenous Given 01/04/16 2318)  LORazepam (ATIVAN) injection 0.5 mg (not administered)  sodium chloride 0.9 % bolus 500 mL (0 mLs Intravenous Stopped 01/04/16 2056)  fentaNYL (SUBLIMAZE) injection 50 mcg (50 mcg Intravenous Given 01/04/16 2011)  ondansetron (ZOFRAN) injection 4 mg (4 mg Intravenous Given 01/04/16 2010)  0.9 %  sodium chloride infusion (10 mL/hr Intravenous New Bag/Given 01/04/16 2056)     NEW OUTPATIENT MEDICATIONS STARTED DURING THIS  VISIT:  None   Note:  This document was prepared using Dragon voice recognition software and may include unintentional dictation errors.  Nanda Quinton, MD Emergency Medicine  Margette Fast, MD 01/04/16 [phone]

## 2016-01-05 DIAGNOSIS — Z7189 Other specified counseling: Secondary | ICD-10-CM

## 2016-01-05 DIAGNOSIS — Z515 Encounter for palliative care: Secondary | ICD-10-CM | POA: Insufficient documentation

## 2016-01-05 DIAGNOSIS — C3492 Malignant neoplasm of unspecified part of left bronchus or lung: Secondary | ICD-10-CM

## 2016-01-05 DIAGNOSIS — K922 Gastrointestinal hemorrhage, unspecified: Secondary | ICD-10-CM | POA: Diagnosis present

## 2016-01-05 DIAGNOSIS — K921 Melena: Secondary | ICD-10-CM

## 2016-01-05 LAB — CBC WITH DIFFERENTIAL/PLATELET
Basophils Absolute: 0 10*3/uL (ref 0.0–0.1)
Basophils Relative: 0 %
Eosinophils Absolute: 0.2 10*3/uL (ref 0.0–0.7)
Eosinophils Relative: 1 %
HEMATOCRIT: 22.5 % — AB (ref 39.0–52.0)
HEMOGLOBIN: 7.9 g/dL — AB (ref 13.0–17.0)
LYMPHS PCT: 6 %
Lymphs Abs: 1 10*3/uL (ref 0.7–4.0)
MCH: 30.6 pg (ref 26.0–34.0)
MCHC: 35.1 g/dL (ref 30.0–36.0)
MCV: 87.2 fL (ref 78.0–100.0)
MONO ABS: 2.4 10*3/uL — AB (ref 0.1–1.0)
Monocytes Relative: 15 %
Neutro Abs: 12.7 10*3/uL — ABNORMAL HIGH (ref 1.7–7.7)
Neutrophils Relative %: 78 %
PLATELETS: 469 10*3/uL — AB (ref 150–400)
RBC: 2.58 MIL/uL — AB (ref 4.22–5.81)
RDW: 14.3 % (ref 11.5–15.5)
WBC: 16.3 10*3/uL — ABNORMAL HIGH (ref 4.0–10.5)

## 2016-01-05 LAB — COMPREHENSIVE METABOLIC PANEL
ALBUMIN: 1.5 g/dL — AB (ref 3.5–5.0)
ALK PHOS: 140 U/L — AB (ref 38–126)
ALT: 17 U/L (ref 17–63)
ANION GAP: 5 (ref 5–15)
AST: 15 U/L (ref 15–41)
BILIRUBIN TOTAL: 1.6 mg/dL — AB (ref 0.3–1.2)
BUN: 21 mg/dL — AB (ref 6–20)
CALCIUM: 7.2 mg/dL — AB (ref 8.9–10.3)
CO2: 23 mmol/L (ref 22–32)
Chloride: 103 mmol/L (ref 101–111)
Creatinine, Ser: 0.45 mg/dL — ABNORMAL LOW (ref 0.61–1.24)
GFR calc Af Amer: >60 mL/min (ref >60)
GLUCOSE: 110 mg/dL — AB (ref 65–99)
Potassium: 3.6 mmol/L (ref 3.5–5.1)
Sodium: 131 mmol/L — ABNORMAL LOW (ref 135–145)
TOTAL PROTEIN: 3.9 g/dL — AB (ref 6.5–8.1)

## 2016-01-05 MED ORDER — OXYCODONE-ACETAMINOPHEN 5-325 MG PO TABS
1.0000 | ORAL_TABLET | ORAL | Status: DC | PRN
Start: 2016-01-05 — End: 2016-01-08

## 2016-01-05 MED ORDER — LORAZEPAM 0.5 MG PO TABS: 0.5000 mg | ORAL_TABLET | Freq: Four times a day (QID) | ORAL | Status: DC | PRN

## 2016-01-05 NOTE — Consult Note (Signed)
Consultation Note Date: 01/05/2016   Patient Name: David Warren  DOB: 1956-04-16  MRN: [id number]  Age / Sex: 60 y.o., male  PCP: Maren Reamer, MD Referring Physician: Thurnell Lose, MD  Reason for Consultation: Establishing goals of care  HPI/Patient Profile: 60 y.o. male   admitted on 01/04/2016     Clinical Assessment and Goals of Care:  60 year old gentleman with a life limiting illness of metastatic lung cancer to abdomen/bowel, recent hospitalization for GI bleed last week, underlying hypertension tobacco use, history of single seizure episode. Patient was recently admitted on 6-28 and discharged on 7-6. He was diagnosed with lung cancer in May. He was due to start palliative chemotherapy but was found to have severely low hemoglobin on blood work and was admitted the last time. He was seen by palliative care, was discharged on home with hospice due to GI bleed and worsening malignancy. Patient returned to the hospital with melenic stools and ongoing weakness. Chart review notes that on 6-29, the patient underwent an EGD which showed nonbleeding gastric ulcer. Tagged red blood cell scan showed active small bowel bleed. Capsule endoscopy revealed multiple metastases on the duodenum and jejunum which were deemed to the source of the bleeding.  Patient is establishing relationships with hospice agency. He lives at home. He is cared for by his family. He has 2 daughters and a wife. He has been admitted with acute blood loss anemia. He has been given blood transfusions. His hemoglobin is improved from 5 g/dL to 7.9 this morning. He is awake and alert. He understands Spanish and converses with his daughter. Palliative care consult her for goals of care discussions.  I introduced myself and palliative care as follows: Palliative medicine is specialized medical care for people living with serious illness. It  focuses on providing relief from the symptoms and stress of a serious illness. The goal is to improve quality of life for both the patient and the family.  Discussed about pros and cons of packed red blood cell transfusions. Quality versus quantity of life and values and wishes attempted to be explored. At present, patient and family wish to seek packed red blood cell transfusions. The patient's quality of life at present is worth preserving as discussed extensively with patient and family this morning. Extensive discussions also undertaken about the burden of recurrent transfusions, the burden of recurrent hospitalizations, ongoing serious illness such as lung cancer and GI bleed from small bowel lung cancer spread. Discussion about managing GI bleed at home with comfort measures-supportive care, dark towels, opioids for pain/shortness of breath discussed in detail. All questions answered to the best of my ability.  NEXT OF KIN  wife, Family makes decisions as a unit. Patient has 2 daughters Lattie Haw and Raquel Sarna.  SUMMARY OF RECOMMENDATIONS    DO NOT RESUSCITATE/DO NOT INTUBATE Home with hospice possibly today Hospice to continue discussions with the patient's family with regards to limited role of blood transfusions given lung cancer and metastases to small bowel, ongoing GI bleed. For now,  patient wishes to continue to come to the hospital for blood transfusions. Will discussions regarding pros and cons of packed red blood cell transfusions at end-of-life, with advancing terminal illness undertaken. Supportive care and active listening provided.  Code Status/Advance Care Planning:  DNR    Symptom Management:    Continue current treatment  Palliative Prophylaxis:   Bowel Regimen  Psycho-social/Spiritual:   Desire for further Chaplaincy support:no  Additional Recommendations: Education on Hospice  Prognosis:   < 4 weeks, could likely be much shorter if the patient develops a profuse  bleed at home.  Discharge Planning: Home with Hospice      Primary Diagnoses: Present on Admission:  . Symptomatic anemia . Adenocarcinoma of left lung, stage 4 ([place]) . CAD (coronary artery disease) . Leukocytosis . UGI bleed  I have reviewed the medical record, interviewed the patient and family, and examined the patient. The following aspects are pertinent.  Past Medical History  Diagnosis Date  . Tobacco abuse   . Lipoma of neck   . Hypertension   . Seizures ([place])     once several years ago  . Adenocarcinoma of left lung, stage 3 ([place]) 11/13/2015   Social History   Social History  . Marital Status: Married    Spouse Name: N/A  . Number of Children: N/A  . Years of Education: N/A   Social History Main Topics  . Smoking status: Former Smoker -- 1.00 packs/day for 35 years    Types: Cigarettes    Quit date: 10/30/2015  . Smokeless tobacco: Never Used  . Alcohol Use: 7.2 oz/week    12 Cans of beer per week     Comment: 2-6 cans beer daily, last drink yesterday  . Drug Use: Yes    Special: Cocaine  . Sexual Activity: Yes   Other Topics Concern  . None   Social History Narrative   ** Merged History Encounter **       Family History  Problem Relation Age of Onset  . Family history unknown: Yes   Scheduled Meds: . famotidine (PEPCID) IV  20 mg Intravenous Q12H   Continuous Infusions: . sodium chloride 100 mL/hr at 01/04/16 2318   PRN Meds:.HYDROmorphone (DILAUDID) injection, LORazepam, ondansetron **OR** ondansetron (ZOFRAN) IV Medications Prior to Admission:  Prior to Admission medications   Medication Sig Start Date End Date Taking? Authorizing Provider  Docusate Calcium (STOOL SOFTENER PO) Take by mouth daily as needed (constipation.).   Yes Historical Provider, MD  ondansetron (ZOFRAN) 4 MG tablet Take 1 tablet (4 mg total) by mouth every 6 (six) hours as needed for nausea. 12/31/15  Yes Belkys A Regalado, MD  oxyCODONE-acetaminophen (PERCOCET) 5-325 MG  tablet Take 1-2 tablets by mouth every 4 (four) hours as needed for severe pain. 12/31/15  Yes Belkys A Regalado, MD  pantoprazole (PROTONIX) 40 MG tablet Take 1 tablet (40 mg total) by mouth 2 (two) times daily. 12/31/15  Yes Belkys A Regalado, MD  folic acid (FOLVITE) 1 MG tablet Take 1 tablet (1 mg total) by mouth daily. Patient not taking: Reported on 01/04/2016 12/05/15   Curt Bears, MD  LORazepam (ATIVAN) 0.5 MG tablet Take 0.5 mg by mouth every 6 (six) hours as needed for anxiety.    Historical Provider, MD   Allergies  Allergen Reactions  . Bee Venom Anaphylaxis   Review of Systems Positive for fatigue, positive for melenic stools Physical Exam Weak appearing hispanic gentleman S1 S2 Abdomen soft mildly distended Clear Mass on L  side of neck No edema Awake alert, appears fatigued Is able to understand daughter and answer appropriately  Vital Signs: BP 130/48 mmHg  Pulse 90  Temp(Src) 98.7 F (37.1 C) (Oral)  Resp 18  Ht '5\' 4"'$  (1.626 m)  Wt 54.749 kg (120 lb 11.2 oz)  BMI 20.71 kg/m2  SpO2 98% Pain Assessment: 0-10 POSS *See Group Information*: 1-Acceptable,Awake and alert Pain Score: 8    SpO2: SpO2: 98 % O2 Device:SpO2: 98 % O2 Flow Rate: .O2 Flow Rate (L/min): 2 L/min  IO: Intake/output summary:  Intake/Output Summary (Last 24 hours) at 01/05/16 0956 Last data filed at 01/05/16 0533  Gross per 24 hour  Intake   2060 ml  Output      0 ml  Net   2060 ml    LBM:   Baseline Weight: Weight: 54.749 kg (120 lb 11.2 oz) Most recent weight: Weight: 54.749 kg (120 lb 11.2 oz)     Palliative Assessment/Data:   Flowsheet Rows        Most Recent Value   Intake Tab    Referral Department  Hospitalist   Unit at Time of Referral  Oncology Unit   Palliative Care Primary Diagnosis  Cancer   Palliative Care Type  Return patient Palliative Care   Reason for referral  Non-pain Symptom, Clarify Goals of Care   Date first seen by Palliative Care  01/05/16   Clinical  Assessment    Palliative Performance Scale Score  30%   Pain Max last 24 hours  4   Pain Min Last 24 hours  3   Dyspnea Max Last 24 Hours  4   Dyspnea Min Last 24 hours  3   Psychosocial & Spiritual Assessment    Palliative Care Outcomes    Patient/Family meeting held?  Yes   Who was at the meeting?  patient wife daughter Dr Candiss Norse    Palliative Care Outcomes  Clarified goals of care, Counseled regarding hospice   Palliative Care follow-up planned  Yes, Home   Palliative Care Follow-up Reason  Clarify goals of care      Time In:  8 Time Out:  9 Time Total:   60 min  Greater than 50%  of this time was spent counseling and coordinating care related to the above assessment and plan.  Signed by: Loistine Chance, MD  [phone]  Please contact Palliative Medicine Team phone at [phone] for questions and concerns.  For individual provider: See Shea Evans

## 2016-01-05 NOTE — Discharge Instructions (Signed)
Follow with Primary MD Maren Reamer, MD in 7 days, goal of care is hospice and comfort.  Get CBC, CMP, 2 view Chest X ray checked  by Primary MD or SNF MD in 5-7 days ( we routinely change or add medications that can affect your baseline labs and fluid status, therefore we recommend that you get the mentioned basic workup next visit with your PCP, your PCP may decide not to get them or add new tests based on their clinical decision)   Activity: As tolerated with Full fall precautions use walker/cane & assistance as needed   Disposition Home    Diet:   Heart Healthy with feeding assistance and aspiration precautions.  For Heart failure patients - Check your Weight same time everyday, if you gain over 2 pounds, or you develop in leg swelling, experience more shortness of breath or chest pain, call your Primary MD immediately. Follow Cardiac Low Salt Diet and 1.5 lit/day fluid restriction.   On your next visit with your primary care physician please Get Medicines reviewed and adjusted.   Please request your Prim.MD to go over all Hospital Tests and Procedure/Radiological results at the follow up, please get all Hospital records sent to your Prim MD by signing hospital release before you go home.   If you experience worsening of your admission symptoms, develop shortness of breath, life threatening emergency, suicidal or homicidal thoughts you must seek medical attention immediately by calling 911 or calling your MD immediately  if symptoms less severe.  You Must read complete instructions/literature along with all the possible adverse reactions/side effects for all the Medicines you take and that have been prescribed to you. Take any new Medicines after you have completely understood and accpet all the possible adverse reactions/side effects.   Do not drive, operate heavy machinery, perform activities at heights, swimming or participation in water activities or provide baby sitting  services if your were admitted for syncope or siezures until you have seen by Primary MD or a Neurologist and advised to do so again.  Do not drive when taking Pain medications.    Do not take more than prescribed Pain, Sleep and Anxiety Medications  Special Instructions: If you have smoked or chewed Tobacco  in the last 2 yrs please stop smoking, stop any regular Alcohol  and or any Recreational drug use.  Wear Seat belts while driving.   Please note  You were cared for by a hospitalist during your hospital stay. If you have any questions about your discharge medications or the care you received while you were in the hospital after you are discharged, you can call the unit and asked to speak with the hospitalist on call if the hospitalist that took care of you is not available. Once you are discharged, your primary care physician will handle any further medical issues. Please note that NO REFILLS for any discharge medications will be authorized once you are discharged, as it is imperative that you return to your primary care physician (or establish a relationship with a primary care physician if you do not have one) for your aftercare needs so that they can reassess your need for medications and monitor your lab values.

## 2016-01-05 NOTE — Discharge Summary (Addendum)
David Warren GDJ:[id number] DOB: 1956-04-26 DOA: 01/04/2016  PCP: Maren Reamer, MD  Admit date: 01/04/2016  Discharge date: 01/05/2016  Admitted From: Home hospice   Disposition:  Home hospice   Recommendations for Outpatient Follow-up:   Follow up with PCP in 1-2 weeks  PCP Please obtain BMP/CBC, 2 view CXR in 1week,  (see Discharge instructions)   PCP Please follow up on the following pending results: None   Home Health: Hospice   Equipment/Devices: None  Discharge Condition: Guarded    CODE STATUS: DO NOT RESUSCITATE   Diet Recommendation: Heart healthy Consultations: Palliative care  Chief Complaint  Patient presents with  . Dizziness     Brief history of present illness from the day of admission and additional interim summary    David Warren is a 60 y.o. male with medical history significant of metastatic lung cancer to the abdomen/bowel, hypertension, seizure disorder, tobacco use disorder, history of single seizure episode years ago who was recently admitted (12/23/2015) and discharged (12/31/2015) on hospice due to GI bleed and returns to the hospital due to dizziness.  Last month on the 29th, the patient underwent an EGD which show an non bleeding gastric ulcer, but was otherwise negative for a source of bleeding. A tagged red blood cell scan showed an active small bowel bleed. He subsequently underwent a capsule endoscopy which showed multiple metastasis on the duodenum and jejunum which apparently are the source of bleeding.   ED Course: CBC shows a hemoglobin level of 5 g/dL and earlier physical exam by the ED medical staff showed melanotic stools. He is being admitted for blood transfusions and palliative measures.  Hospital issues addressed     1.Melena, upper GI bleed causing acute on  chronic blood loss related anemia caused by small bowel metastasis from stage IV adenocarcinoma of the left lung.  Patient is already home hospice, bleeding has become a recurrent problem, goal of care is comfort only, patient and family clearly told that repeated transfusions will not improve quality of his life and that his prognosis remains extremely guarded, patient and family in the presence of palliative care seems to have good understanding of the same. They said they might go for transfusion one or 2 times and then stop it and go with full comfort care. I will defer this to patient and family along with the hospice team. At this time patient is status post 2 units of packed RBC transfusion in the ER, hemoglobin is stable, he remains very high risk for repeat bleeding and reoccurrence of anemia. However at this point I would not recommend further hospital admissions for transfusions as this seems completely futile.  He is being discharged with home hospice. Had detailed discussions with patient, his daughter and wife in the presence of palliative care physician. They seem to have good understanding.  Provided 3 day supply of Narcotic and Benzo at home dose ( he was out).    2. Adenocarcinoma of the left lung, stage IV with metastasis to small bowel. Home  hospice with comfort measures only at this time.  3. Underlying CAD. Comfort measures only not a candidate for antiplatelet medication or invasive procedures. No further workup.    Discharge diagnosis     Principal Problem:   Symptomatic anemia Active Problems:   CAD (coronary artery disease)   Adenocarcinoma of left lung, stage 4 (HCC)   Leukocytosis   UGI bleed   Encounter for palliative care    Discharge instructions        Discharge Instructions    Diet - low sodium heart healthy    Complete by:  As directed      Discharge instructions    Complete by:  As directed   Follow with Primary MD Maren Reamer, MD in 7  days, goal of care is hospice and comfort.  Get CBC, CMP, 2 view Chest X ray checked  by Primary MD or SNF MD in 5-7 days ( we routinely change or add medications that can affect your baseline labs and fluid status, therefore we recommend that you get the mentioned basic workup next visit with your PCP, your PCP may decide not to get them or add new tests based on their clinical decision)   Activity: As tolerated with Full fall precautions use walker/cane & assistance as needed   Disposition Home    Diet:   Heart Healthy with feeding assistance and aspiration precautions.  For Heart failure patients - Check your Weight same time everyday, if you gain over 2 pounds, or you develop in leg swelling, experience more shortness of breath or chest pain, call your Primary MD immediately. Follow Cardiac Low Salt Diet and 1.5 lit/day fluid restriction.   On your next visit with your primary care physician please Get Medicines reviewed and adjusted.   Please request your Prim.MD to go over all Hospital Tests and Procedure/Radiological results at the follow up, please get all Hospital records sent to your Prim MD by signing hospital release before you go home.   If you experience worsening of your admission symptoms, develop shortness of breath, life threatening emergency, suicidal or homicidal thoughts you must seek medical attention immediately by calling 911 or calling your MD immediately  if symptoms less severe.  You Must read complete instructions/literature along with all the possible adverse reactions/side effects for all the Medicines you take and that have been prescribed to you. Take any new Medicines after you have completely understood and accpet all the possible adverse reactions/side effects.   Do not drive, operate heavy machinery, perform activities at heights, swimming or participation in water activities or provide baby sitting services if your were admitted for syncope or siezures  until you have seen by Primary MD or a Neurologist and advised to do so again.  Do not drive when taking Pain medications.    Do not take more than prescribed Pain, Sleep and Anxiety Medications  Special Instructions: If you have smoked or chewed Tobacco  in the last 2 yrs please stop smoking, stop any regular Alcohol  and or any Recreational drug use.  Wear Seat belts while driving.   Please note  You were cared for by a hospitalist during your hospital stay. If you have any questions about your discharge medications or the care you received while you were in the hospital after you are discharged, you can call the unit and asked to speak with the hospitalist on call if the hospitalist that took care of you is not available. Once you are discharged,  your primary care physician will handle any further medical issues. Please note that NO REFILLS for any discharge medications will be authorized once you are discharged, as it is imperative that you return to your primary care physician (or establish a relationship with a primary care physician if you do not have one) for your aftercare needs so that they can reassess your need for medications and monitor your lab values.     Increase activity slowly    Complete by:  As directed            Discharge Medications     Medication List    TAKE these medications        folic acid 1 MG tablet  Commonly known as:  FOLVITE  Take 1 tablet (1 mg total) by mouth daily.     LORazepam 0.5 MG tablet  Commonly known as:  ATIVAN  Take 1 tablet (0.5 mg total) by mouth every 6 (six) hours as needed for anxiety.     ondansetron 4 MG tablet  Commonly known as:  ZOFRAN  Take 1 tablet (4 mg total) by mouth every 6 (six) hours as needed for nausea.     oxyCODONE-acetaminophen 5-325 MG tablet  Commonly known as:  PERCOCET  Take 1-2 tablets by mouth every 4 (four) hours as needed for severe pain.     pantoprazole 40 MG tablet  Commonly known as:   PROTONIX  Take 1 tablet (40 mg total) by mouth 2 (two) times daily.     STOOL SOFTENER PO  Take by mouth daily as needed (constipation.).        Allergies  Allergen Reactions  . Bee Venom Anaphylaxis    Follow-up Information    Follow up with Maren Reamer, MD. Schedule an appointment as soon as possible for a visit in 1 week.   Specialty:  Internal Medicine   Contact information:   [place] [id number] [phone]       Major procedures and Radiology Reports - PLEASE review detailed and final reports thoroughly  -          Micro Results      No results found for this or any previous visit (from the past 240 hour(s)).  Today   Subjective    David Warren today has no headache,no chest abdominal pain,no new weakness tingling or numbness, feels much better wants to go home today.      Objective   Blood pressure 130/48, pulse 90, temperature 98.7 F (37.1 C), temperature source Oral, resp. rate 18, height '5\' 4"'$  (1.626 m), weight 54.749 kg (120 lb 11.2 oz), SpO2 98 %.   Intake/Output Summary (Last 24 hours) at 01/05/16 1635 Last data filed at 01/05/16 0533  Gross per 24 hour  Intake   2060 ml  Output      0 ml  Net   2060 ml    Exam Awake Alert, No new F.N deficits, Normal affect [place].AT,PERRAL Supple Neck,No JVD, No cervical lymphadenopathy appriciated. Large neck mass Symmetrical Chest wall movement, Good air movement bilaterally, CTAB RRR,No Gallops,Rubs or new Murmurs, No Parasternal Heave +ve B.Sounds, Abd Soft, Non tender, No organomegaly appriciated, No rebound -guarding or rigidity. No Cyanosis, Clubbing or edema, No new Rash or bruise   Data Review   CBC w Diff:  Lab Results  Component Value Date   WBC 16.3* 01/05/2016   WBC 18.0* 12/23/2015   HGB 7.9* 01/05/2016   HGB 5.7* 12/23/2015  HCT 22.5* 01/05/2016   HCT 18.7* 12/23/2015   PLT 469* 01/05/2016   PLT 764* 12/23/2015   LYMPHOPCT 6 01/05/2016   LYMPHOPCT 4.5*  12/23/2015   MONOPCT 15 01/05/2016   MONOPCT 6.4 12/23/2015   EOSPCT 1 01/05/2016   EOSPCT 0.1 12/23/2015   BASOPCT 0 01/05/2016   BASOPCT 0.2 12/23/2015    CMP:  Lab Results  Component Value Date   NA 131* 01/05/2016   NA 126* 12/23/2015   K 3.6 01/05/2016   K 4.2 12/23/2015   CL 103 01/05/2016   CO2 23 01/05/2016   CO2 23 12/23/2015   BUN 21* 01/05/2016   BUN 9.9 12/23/2015   CREATININE 0.45* 01/05/2016   CREATININE 0.6* 12/23/2015   PROT 3.9* 01/05/2016   PROT 5.8* 12/23/2015   ALBUMIN 1.5* 01/05/2016   ALBUMIN 2.3* 12/23/2015   BILITOT 1.6* 01/05/2016   BILITOT <0.30 12/23/2015   ALKPHOS 140* 01/05/2016   ALKPHOS 136 12/23/2015   AST 15 01/05/2016   AST 17 12/23/2015   ALT 17 01/05/2016   ALT 9 12/23/2015  .   Total Time in preparing paper work, data evaluation and todays exam - 35 minutes  Thurnell Lose M.D on 01/05/2016 at 4:35 PM  [facility]   Office  [phone]

## 2016-01-05 NOTE — Progress Notes (Signed)
[facility]-G[facility] RN Visit  This is a related GIP admission from 01/04/16 to [facility] diagnosis of Non-small cell Lung CA, per Dr. Tomasa Hosteller. Patient is a DNR. Patient admitted with symptomatic anemia. His Hgb at admission was 5 g/dL and has improved to 7.9 after being given blood transfusions.  Patient seen in room, resting in bed. Daughter, Quentin Angst at bedside and interpreting for patient, per his request.  Patient denies any dizziness or pain at this time. He has received 2 doses of 1 mg Dilaudid for pain and 1 doses of 0.5 mg Ativan for anxiety.  Per chart review, patient has received a total of 3 units of PRBC's.  Discussed hospice care and equipment in the home.  Patient denies any additional needs in the home and family denies any hospice-related questions.  Per discussion, family stated patient will be discharged today and daughter will be taking him home via private vehicle.  Hospice will continue to follow.  Please call with hospice-related questions or concerns.  Thank you, Freddi Starr RN, [facility] Liaison [phone]

## 2016-01-05 NOTE — Progress Notes (Signed)
Nutrition Brief Note  Patient identified on the Malnutrition Screening Tool (MST) Report  Wt Readings from Last 15 Encounters:  01/04/16 120 lb 11.2 oz (54.749 kg)  12/24/15 119 lb (53.978 kg)  12/23/15 121 lb 9.6 oz (55.157 kg)  12/01/15 124 lb 1.6 oz (56.291 kg)  11/25/15 124 lb (56.246 kg)  11/13/15 121 lb 4.8 oz (55.021 kg)  11/05/15 123 lb (55.792 kg)  11/03/15 123 lb (55.792 kg)  10/30/15 123 lb (55.792 kg)  10/26/15 123 lb 3.2 oz (55.883 kg)  10/23/15 118 lb 9.6 oz (53.797 kg)  10/20/15 132 lb (59.875 kg)  12/09/13 132 lb 4 oz (59.988 kg)    Body mass index is 20.71 kg/(m^2). Patient meets criteria for normal weight based on current BMI.   Pt with hx of stage 4 lung cancer. Palliative Care team met with pt's family this AM; pt DNR and plan to d/c home with hospice, possible today.   Current diet order is NPO. Labs and medications reviewed.   No nutrition interventions warranted at this time. If nutrition issues arise, please consult RD.      Jarome Matin, MS, RD, LDN Inpatient Clinical Dietitian Pager # [phone] After hours/weekend pager # [phone]

## 2016-01-06 ENCOUNTER — Telehealth: Payer: Self-pay | Admitting: Oncology

## 2016-01-06 ENCOUNTER — Telehealth: Payer: Self-pay | Admitting: Medical Oncology

## 2016-01-06 ENCOUNTER — Other Ambulatory Visit: Payer: Self-pay

## 2016-01-06 NOTE — Telephone Encounter (Signed)
Kathie Dike, patient's daughter regarding upcoming appointment for radiation.  Advised her that if patient is on hospice, we can cancel the appointment.  Quentin Angst said she was not sure what the appointment was for but thinks it may be for GI bleeding.  Advised her that I would call scheduling and Dr. Worthy Flank office to find out and call her back.  Marylen Ponto, Medical Secretary who said a referral came through yesterday. Also called Abelina Bachelor, RN who said that Dr. Julien Nordmann is out today but has been contacted via inbasket.

## 2016-01-06 NOTE — Telephone Encounter (Signed)
Daughter stated she got a call today about appt for radiation . He is under hospice care. She does not know what it is for. CC note to [employer] , nurse and [place].

## 2016-01-07 ENCOUNTER — Ambulatory Visit
Admit: 2016-01-07 | Discharge: 2016-01-07 | Disposition: A | Discharge status: Home / Self Care | Payer: No Typology Code available for payment source | Attending: Radiation Oncology | Admitting: Radiation Oncology

## 2016-01-07 ENCOUNTER — Encounter (HOSPITAL_COMMUNITY): Payer: Self-pay | Admitting: Emergency Medicine

## 2016-01-07 ENCOUNTER — Inpatient Hospital Stay (HOSPITAL_COMMUNITY)
Admission: EM | Admit: 2016-01-07 | Discharge: 2016-01-08 | DRG: 375 | Disposition: A | Discharge status: Hospice | Payer: No Typology Code available for payment source | Attending: Internal Medicine | Admitting: Internal Medicine

## 2016-01-07 ENCOUNTER — Emergency Department (HOSPITAL_COMMUNITY): Payer: Self-pay

## 2016-01-07 DIAGNOSIS — D509 Iron deficiency anemia, unspecified: Secondary | ICD-10-CM | POA: Diagnosis present

## 2016-01-07 DIAGNOSIS — Z66 Do not resuscitate: Secondary | ICD-10-CM | POA: Diagnosis present

## 2016-01-07 DIAGNOSIS — I2510 Atherosclerotic heart disease of native coronary artery without angina pectoris: Secondary | ICD-10-CM | POA: Diagnosis present

## 2016-01-07 DIAGNOSIS — K922 Gastrointestinal hemorrhage, unspecified: Secondary | ICD-10-CM

## 2016-01-07 DIAGNOSIS — D62 Acute posthemorrhagic anemia: Secondary | ICD-10-CM

## 2016-01-07 DIAGNOSIS — Z515 Encounter for palliative care: Secondary | ICD-10-CM | POA: Diagnosis present

## 2016-01-07 DIAGNOSIS — K921 Melena: Secondary | ICD-10-CM | POA: Diagnosis present

## 2016-01-07 DIAGNOSIS — D649 Anemia, unspecified: Secondary | ICD-10-CM

## 2016-01-07 DIAGNOSIS — Z91030 Bee allergy status: Secondary | ICD-10-CM

## 2016-01-07 DIAGNOSIS — C3492 Malignant neoplasm of unspecified part of left bronchus or lung: Secondary | ICD-10-CM

## 2016-01-07 DIAGNOSIS — C3490 Malignant neoplasm of unspecified part of unspecified bronchus or lung: Secondary | ICD-10-CM

## 2016-01-07 DIAGNOSIS — Z720 Tobacco use: Secondary | ICD-10-CM | POA: Diagnosis present

## 2016-01-07 DIAGNOSIS — R000 Tachycardia, unspecified: Secondary | ICD-10-CM | POA: Diagnosis present

## 2016-01-07 DIAGNOSIS — C7971 Secondary malignant neoplasm of right adrenal gland: Secondary | ICD-10-CM | POA: Diagnosis present

## 2016-01-07 DIAGNOSIS — Z79899 Other long term (current) drug therapy: Secondary | ICD-10-CM

## 2016-01-07 DIAGNOSIS — I10 Essential (primary) hypertension: Secondary | ICD-10-CM | POA: Diagnosis present

## 2016-01-07 DIAGNOSIS — R451 Restlessness and agitation: Secondary | ICD-10-CM | POA: Diagnosis not present

## 2016-01-07 DIAGNOSIS — Z87891 Personal history of nicotine dependence: Secondary | ICD-10-CM

## 2016-01-07 DIAGNOSIS — C785 Secondary malignant neoplasm of large intestine and rectum: Secondary | ICD-10-CM | POA: Diagnosis present

## 2016-01-07 DIAGNOSIS — C784 Secondary malignant neoplasm of small intestine: Principal | ICD-10-CM | POA: Diagnosis present

## 2016-01-07 LAB — URINALYSIS, ROUTINE W REFLEX MICROSCOPIC
BILIRUBIN URINE: NEGATIVE
Glucose, UA: NEGATIVE mg/dL
HGB URINE DIPSTICK: NEGATIVE
Ketones, ur: NEGATIVE mg/dL
Leukocytes, UA: NEGATIVE
NITRITE: NEGATIVE
PROTEIN: NEGATIVE mg/dL
Specific Gravity, Urine: 1.017 (ref 1.005–1.030)
pH: 5.5 (ref 5.0–8.0)

## 2016-01-07 LAB — HEPATIC FUNCTION PANEL
ALBUMIN: 1.3 g/dL — AB (ref 3.5–5.0)
ALT: 14 U/L — ABNORMAL LOW (ref 17–63)
AST: 16 U/L (ref 15–41)
Alkaline Phosphatase: 145 U/L — ABNORMAL HIGH (ref 38–126)
BILIRUBIN INDIRECT: 0.1 mg/dL — AB (ref 0.3–0.9)
Bilirubin, Direct: 0.2 mg/dL (ref 0.1–0.5)
TOTAL PROTEIN: 3.9 g/dL — AB (ref 6.5–8.1)
Total Bilirubin: 0.3 mg/dL (ref 0.3–1.2)

## 2016-01-07 LAB — CBC WITH DIFFERENTIAL/PLATELET
Basophils Absolute: 0 10*3/uL (ref 0.0–0.1)
Basophils Relative: 0 %
EOS ABS: 0 10*3/uL (ref 0.0–0.7)
Eosinophils Relative: 0 %
HCT: 12.5 % — ABNORMAL LOW (ref 39.0–52.0)
Hemoglobin: 4.1 g/dL — CL (ref 13.0–17.0)
LYMPHS ABS: 1.1 10*3/uL (ref 0.7–4.0)
Lymphocytes Relative: 5 %
MCH: 29.9 pg (ref 26.0–34.0)
MCHC: 32.8 g/dL (ref 30.0–36.0)
MCV: 91.2 fL (ref 78.0–100.0)
MONO ABS: 2.5 10*3/uL — AB (ref 0.1–1.0)
Monocytes Relative: 12 %
NEUTROS ABS: 17.5 10*3/uL — AB (ref 1.7–7.7)
Neutrophils Relative %: 83 %
PLATELETS: 665 10*3/uL — AB (ref 150–400)
RBC: 1.37 MIL/uL — ABNORMAL LOW (ref 4.22–5.81)
RDW: 15.8 % — AB (ref 11.5–15.5)
WBC: 21.1 10*3/uL — ABNORMAL HIGH (ref 4.0–10.5)

## 2016-01-07 LAB — TYPE AND SCREEN
ABO/RH(D): AB POS
ANTIBODY SCREEN: NEGATIVE
UNIT DIVISION: 0
UNIT DIVISION: 0
Unit division: 0

## 2016-01-07 LAB — PROTIME-INR
INR: 1.21 (ref 0.00–1.49)
PROTHROMBIN TIME: 15 s (ref 11.6–15.2)

## 2016-01-07 LAB — PREPARE RBC (CROSSMATCH)

## 2016-01-07 MED ORDER — MORPHINE SULFATE (PF) 2 MG/ML IV SOLN
2.0000 mg | INTRAVENOUS | Status: DC | PRN
  Administered 2016-01-07: 2 mg via INTRAVENOUS

## 2016-01-07 MED ORDER — ZOLPIDEM TARTRATE 5 MG PO TABS
5.0000 mg | ORAL_TABLET | Freq: Once | ORAL | Status: AC
  Administered 2016-01-07: 5 mg via ORAL
  Filled 2016-01-07: qty 1

## 2016-01-07 MED ORDER — HYDROMORPHONE HCL 1 MG/ML IJ SOLN
0.5000 mg | INTRAMUSCULAR | Status: DC | PRN
  Administered 2016-01-07: 0.5 mg via INTRAVENOUS

## 2016-01-07 MED ORDER — LORAZEPAM 2 MG/ML IJ SOLN
1.0000 mg | Freq: Once | INTRAMUSCULAR | Status: AC
  Administered 2016-01-07: 1 mg via INTRAVENOUS
  Filled 2016-01-07: qty 1

## 2016-01-07 MED ORDER — MORPHINE SULFATE (PF) 2 MG/ML IV SOLN
INTRAVENOUS | Status: AC
  Filled 2016-01-07: qty 1

## 2016-01-07 MED ORDER — HYDROMORPHONE HCL 1 MG/ML IJ SOLN
0.5000 mg | INTRAMUSCULAR | Status: DC
  Administered 2016-01-07 – 2016-01-08 (×4): 0.5 mg via INTRAVENOUS
  Filled 2016-01-07 (×5): qty 1

## 2016-01-07 MED ORDER — SODIUM CHLORIDE 0.9% FLUSH
3.0000 mL | Freq: Two times a day (BID) | INTRAVENOUS | Status: DC
  Administered 2016-01-07: 3 mL via INTRAVENOUS

## 2016-01-07 MED ORDER — SODIUM CHLORIDE 0.9 % IV BOLUS (SEPSIS)
1000.0000 mL | Freq: Once | INTRAVENOUS | Status: AC
  Administered 2016-01-07: 1000 mL via INTRAVENOUS

## 2016-01-07 MED ORDER — SODIUM CHLORIDE 0.9 % IV SOLN
10.0000 mL/h | Freq: Once | INTRAVENOUS | Status: AC
  Administered 2016-01-07: 10 mL/h via INTRAVENOUS

## 2016-01-07 NOTE — ED Notes (Signed)
Pt can go to floor at 12:15.

## 2016-01-07 NOTE — Progress Notes (Signed)
[facility] RN Visit  This is a related GIP admission from 01/07/16 to [facility] diagnosis of Non-small cell Lung CA, per Dr. Tomasa Hosteller. Patient has an out of facility DNR.  He was brought to the ED by his family, after c/o dizziness, weakness and severe abdominal pain.  His hemoglobin was 4.1.  He is being admitted for symptomatic anemia.  Patient seen in room, laying in bed, with wife at bedside. Daughter, Quentin Angst at bedside and interpreting for patient, per his request. Patient unable to lay still and daughter verbalized he is feeling extremely anxious at this time. He is also complaining of mid, abdominal pain.  Spoke to bedside RN regarding the anxiety and pain. He has received one dose of 2 mg Morphine IV for pain and orders obtained for IV Ativan for the anxiety.  Patient has received one unit of PRBC's and is scheduled to receive 3 more. Family stated they are awaiting a consult from Rad Oncology to see if there are any options to control the bleeding at this time.  Spoke to family about the possibility of transferring to [facility].  They stated they are still considering this option, however, would like to speak with Rad/Onc before making any decisions.  Spoke to [employer] social worker, Rolm Bookbinder who plans to speak with family in the morning to further discuss goals of care.  Per discussion, she had a scheduled visit this afternoon, which when she arrived at the home learned of the transfer to the ED at that time.  Dr. Tomasa Hosteller, hospice MD, feels the patient would be [facility] appropriate, pending the understanding he would not be receiving anymore blood products and the goal would be comfort measures only.  [facility] will continue to follow and anticipate any discharge needs.  Appreciate assistance with PMT in addressing the complex symptom management needs and goals of care.  Please call with hospice-related questions or concerns.  Thank you, Freddi Starr RN, [facility] Liaison [phone]

## 2016-01-07 NOTE — H&P (Signed)
History and Physical  David Warren ZDG:[id number] DOB: 1955/09/03 DOA: 01/07/2016  Referring physician: EDP PCP: Maren Reamer, MD   Chief Complaint: dizziness, abdominal pain, melena  HPI: David Warren is a 60 y.o. male   With h/o metastatic NSCLC with mets to GI tract who was discharged from the hospital two days ago to home with home hospice is brought back to the hospital due to continued melena, feeling dizzy , very pale and c/o abdominal pain. In the ED he is found to have sinus tachycardia with hgb 4.1, EDP ordered 4units of prbc to be transfused and request patient being admitted to the hospital. Per family, they have not got a change to get to work with home hospice agency yet, they are having concerns of not being able to manage care at home, they also have concern about ongoing bleed and pain control. They understand the cancer process is not reversible, but they desire patient being treated so that patient will feel better. EDP paged palliative care as well.  Review of Systems:  Detail per HPI, Review of systems are otherwise negative  Past Medical History  Diagnosis Date  . Tobacco abuse   . Lipoma of neck   . Hypertension   . Seizures ([place])     once several years ago  . Adenocarcinoma of left lung, stage 3 ([place]) 11/13/2015   Past Surgical History  Procedure Laterality Date  . No past surgeries    . Electromagnetic navigation brochoscopy N/A 11/05/2015    Procedure: ELECTROMAGNETIC NAVIGATION BRONCHOSCOPY;  Surgeon: Flora Lipps, MD;  Location: [facility] ORS;  Service: Cardiopulmonary;  Laterality: N/A;  . Endobronchial ultrasound N/A 11/05/2015    Procedure: ENDOBRONCHIAL ULTRASOUND;  Surgeon: Flora Lipps, MD;  Location: [facility] ORS;  Service: Cardiopulmonary;  Laterality: N/A;  . Esophagogastroduodenoscopy (egd) with propofol N/A 12/24/2015    Procedure: ESOPHAGOGASTRODUODENOSCOPY (EGD) WITH PROPOFOL;  Surgeon: Wilford Corner, MD;  Location[facility];  Service: Endoscopy;   Laterality: N/A;  . Givens capsule study N/A 12/27/2015    Procedure: GIVENS CAPSULE STUDY;  Surgeon: Wilford Corner, MD;  Location: [facility] ENDOSCOPY;  Service: Endoscopy;  Laterality: N/A;   Social History:  reports that he quit smoking about 2 months ago. His smoking use included Cigarettes. He has a 35 pack-year smoking history. He has never used smokeless tobacco. He reports that he drinks about 7.2 oz of alcohol per week. He reports that he uses illicit drugs (Cocaine). Patient lives at home& is able to participate in activities of daily living with assistance  Allergies  Allergen Reactions  . Bee Venom Anaphylaxis    Family History  Problem Relation Age of Onset  . Family history unknown: Yes      Prior to Admission medications   Medication Sig Start Date End Date Taking? Authorizing Provider  Docusate Calcium (STOOL SOFTENER PO) Take 1 capsule by mouth daily as needed (constipation.).    Yes Historical Provider, MD  folic acid (FOLVITE) 1 MG tablet Take 1 tablet (1 mg total) by mouth daily. 12/05/15  Yes Curt Bears, MD  LORazepam (ATIVAN) 0.5 MG tablet Take 1 tablet (0.5 mg total) by mouth every 6 (six) hours as needed for anxiety. 01/05/16  Yes Thurnell Lose, MD  ondansetron (ZOFRAN) 4 MG tablet Take 1 tablet (4 mg total) by mouth every 6 (six) hours as needed for nausea. 12/31/15  Yes Belkys A Regalado, MD  oxyCODONE-acetaminophen (PERCOCET) 5-325 MG tablet Take 1-2 tablets by mouth every 4 (four) hours as needed  for severe pain. 01/05/16  Yes Thurnell Lose, MD  pantoprazole (PROTONIX) 40 MG tablet Take 1 tablet (40 mg total) by mouth 2 (two) times daily. 12/31/15  Yes Belkys A Regalado, MD    Physical Exam: BP 104/44 mmHg  Pulse 95  Temp(Src) 99.6 F (37.6 C) (Rectal)  Resp 13  SpO2 99%  General:  Frail, cachectic  Eyes: PERRL ENT: unremarkable Neck: supple, no JVD Cardiovascular: sinus tachycardia Respiratory: CTABL Abdomen: tender to palpation epigastric region,  ND, positive bowel sounds Skin: no rash Musculoskeletal:  No edema Psychiatric: calm/cooperative Neurologic: no focal findings            Labs on Admission:  Basic Metabolic Panel:  Recent Labs Lab 01/04/16 1823 01/05/16 0711  NA 130* 131*  K 4.1 3.6  CL 99* 103  CO2 20* 23  GLUCOSE 112* 110*  BUN 26* 21*  CREATININE 0.63 0.45*  CALCIUM 7.6* 7.2*   Liver Function Tests:  Recent Labs Lab 01/05/16 0711 01/07/16 0916  AST 15 16  ALT 17 14*  ALKPHOS 140* 145*  BILITOT 1.6* 0.3  PROT 3.9* 3.9*  ALBUMIN 1.5* 1.3*   No results for input(s): LIPASE, AMYLASE in the last 168 hours. No results for input(s): AMMONIA in the last 168 hours. CBC:  Recent Labs Lab 01/04/16 1823 01/05/16 0711 01/07/16 0916  WBC 20.6* 16.3* 21.1*  NEUTROABS  --  12.7* 17.5*  HGB 5.0* 7.9* 4.1*  HCT 14.7* 22.5* 12.5*  MCV 90.7 87.2 91.2  PLT 651* 469* 665*   Cardiac Enzymes: No results for input(s): CKTOTAL, CKMB, CKMBINDEX, TROPONINI in the last 168 hours.  BNP (last 3 results) No results for input(s): BNP in the last 8760 hours.  ProBNP (last 3 results) No results for input(s): PROBNP in the last 8760 hours.  CBG: No results for input(s): GLUCAP in the last 168 hours.  Radiological Exams on Admission: Dg Chest 1 View  01/07/2016  CLINICAL DATA:  Lung cancer has spread to kidneys and intestines. EXAM: CHEST 1 VIEW COMPARISON:  10/20/2015 FINDINGS: Lungs are hyperexpanded. The lungs are clear wiithout focal pneumonia, edema, pneumothorax or pleural effusion. Interstitial markings are diffusely coarsened with chronic features. The cardiopericardial silhouette is within normal limits for size. The visualized bony structures of the thorax are intact. Telemetry leads overlie the chest. IMPRESSION: Hyperinflation with chronic interstitial coarsening suggests emphysema. No acute cardiopulmonary findings. Electronically Signed   By: Misty Stanley M.D.   On: 01/07/2016 10:01      Assessment/Plan Present on Admission:  **None**  Gi bleed from cancer mets, symptomatic anemia: family agreed to get prbc transfusion which is started in the ED, patient and family desire palliative treatment. I contacted patient;s oncologist Dr Julien Nordmann who recommended rad onc consult for possible palliative XRT, I have discussed with radiation oncology Dr Sondra Come who graciously agreed to evaluate patient to see if he would be a candidate for palliative XRT to gi mets for pain control and to stop bleeding.  Palliative care and hospice also on board, plan to discharge to residential hospice after radiation oncology evaluation.    DVT prophylaxis: scd's  Consultants: palliative care consulted by EDP  Code Status: DNR  Family Communication:  Patient and family members (daughter and wife in room)  Disposition Plan: admit to med tele  Time spent: 37mns, more than 50% time spent on coordination of care  Xavion Muscat MD, PhD [facility] Pager [phone] 7PM-7AM, please contact night-coverage at [email or web address], password [email or web address]

## 2016-01-07 NOTE — ED Notes (Signed)
Pt brought to ED by family, pt c/o dizziness, mid abd pain onset yesterday. Pt appears very pale, per daughter this is new. Recent discharge from [facility]

## 2016-01-07 NOTE — Progress Notes (Signed)
Case discussed in detail with Dr. Erlinda Hong and Hospice RN. He is active with [facility] but has not been home long enough for the full scope of hospice services to be started at home. Goal for this admission will be to explore all possible options to stop metastatic related GI bleeding, limited transfusion only short term and a focus on symptom management- he has very complex management needs with ongoing GIB-he would be [facility] for [facility]. I will assist with goals of care and follow up in AM. I have made recommendations for IV symptom management based on appropriate opiate dosing given his home regimen of oxycodone and ativan.  Please admit patient under hospice inpatient status and not observation.   Lane Hacker, DO Palliative Medicine [phone]

## 2016-01-08 DIAGNOSIS — Z720 Tobacco use: Secondary | ICD-10-CM

## 2016-01-08 DIAGNOSIS — C3492 Malignant neoplasm of unspecified part of left bronchus or lung: Secondary | ICD-10-CM

## 2016-01-08 DIAGNOSIS — I2510 Atherosclerotic heart disease of native coronary artery without angina pectoris: Secondary | ICD-10-CM

## 2016-01-08 DIAGNOSIS — D62 Acute posthemorrhagic anemia: Secondary | ICD-10-CM

## 2016-01-08 DIAGNOSIS — D509 Iron deficiency anemia, unspecified: Secondary | ICD-10-CM

## 2016-01-08 DIAGNOSIS — K922 Gastrointestinal hemorrhage, unspecified: Secondary | ICD-10-CM

## 2016-01-08 LAB — TYPE AND SCREEN
ABO/RH(D): AB POS
Antibody Screen: NEGATIVE
UNIT DIVISION: 0
UNIT DIVISION: 0
UNIT DIVISION: 0
Unit division: 0

## 2016-01-08 MED ORDER — MORPHINE SULFATE (CONCENTRATE) 10 MG/0.5ML PO SOLN: 20.0000 mg | ORAL | Status: DC

## 2016-01-08 MED ORDER — MORPHINE SULFATE (CONCENTRATE) 10 MG/0.5ML PO SOLN: 20.0000 mg | ORAL | Status: DC | PRN

## 2016-01-08 MED ORDER — MORPHINE SULFATE (CONCENTRATE) 10 MG/0.5ML PO SOLN: 20.0000 mg | ORAL | Status: AC

## 2016-01-08 MED ORDER — MORPHINE SULFATE (CONCENTRATE) 10 MG/0.5ML PO SOLN: 20.0000 mg | ORAL | Status: AC | PRN

## 2016-01-08 MED ORDER — ATROPINE SULFATE 1 % OP SOLN: 2.0000 [drp] | OPHTHALMIC | Status: AC | PRN

## 2016-01-08 MED ORDER — LORAZEPAM 2 MG/ML PO CONC: 1.0000 mg | ORAL | Status: AC | PRN

## 2016-01-08 MED ORDER — LORAZEPAM 2 MG/ML PO CONC: 1.0000 mg | Freq: Two times a day (BID) | ORAL | Status: AC

## 2016-01-08 MED ORDER — LORAZEPAM 2 MG/ML PO CONC: 1.0000 mg | Freq: Two times a day (BID) | ORAL | Status: DC

## 2016-01-08 MED ORDER — ATROPINE SULFATE 1 % OP SOLN
2.0000 [drp] | OPHTHALMIC | Status: DC | PRN
  Filled 2016-01-08: qty 2

## 2016-01-08 MED ORDER — LORAZEPAM 2 MG/ML PO CONC: 1.0000 mg | ORAL | Status: DC | PRN

## 2016-01-08 NOTE — Discharge Summary (Addendum)
Physician Discharge Summary  David Warren JWJ:[id number] DOB: September 04, 1955 DOA: 01/07/2016  PCP: Maren Reamer, MD  Admit date: 01/07/2016 Discharge date: 01/08/2016  Admitted From: home Disposition:  Home    Recommendations for Outpatient Follow-up:  1. Home with hospice. No re-admissions for blood transfusions.   Discharge Condition:  stable   CODE STATUS:  DNR   Diet recommendation:  Regular diet Consultations:  Palliative care    Discharge Diagnoses:  Principal Problem:   Acute blood loss anemia/   Lower GI bleed Active Problems:   Adenocarcinoma of left lung, stage 4 (HCC)   Tobacco abuse   CAD (coronary artery disease)   Iron deficiency anemia     Subjective: No complaints today. Tells me that he does not know what is happening and states that no one has explained it to him. He re-iterates that he would like to go home from the hospital and does not want to consider hospice home.   Brief Summary: With h/o metastatic NSCLC with mets to GI tract who was discharged from the hospital two days ago to home with home hospice is brought back to the hospital due to continued melena, feeling dizzy , very pale and c/o abdominal pain. In the ED he is found to have sinus tachycardia with hgb 4.1. This has dropped from a Hb of 7.9 on 7/11.   ED phyxician requests patient being admitted to the hospital. Per family, they have not got a change to get to work with home hospice agency yet. They are having concerns of not being able to manage care at home and have concern about ongoing bleed and pain control. They understand the cancer process is not reversible, but they desire patient being treated so that patient will feel better.   Hospital Course:  GI bleed from metastatic NSCL cancer  - lung CA metastasized to the bowel (innumerable mets in small and large bowel) with resultant bleeding - 2 U PC transfused for Hb of 4.1- the patient has been explained that, in keeping with the  philosophy of palliative care,  further transfusions will not be offered  - radiation oncology feels that radiation will cause more harm than good and has discussed this with him and his daughter in detail - palliative care was consulted in the ER and spoke with patient and family yesterday - Dr Hilma Favors (palliative care) has taken care of discharge medications - see below - unfortunately, at this time, he had no further medical or surgical options for treatment of his cancer- I have explained this to him in the presence of his daughter (who is translating), his wife, the hospital social worker, the hospice social worker and the nurse. All of his and his family's questions have been addressed. There is a concern that his 24 y/o grandson is currently living with him and will be exposed to a the patient's severe bleeding and ultimate death. The hospice social worker is working Dispensing optician with the family on minimizing the trauma for the child.   Discharge Instructions     Medication List    STOP taking these medications        folic acid 1 MG tablet  Commonly known as:  FOLVITE     LORazepam 0.5 MG tablet  Commonly known as:  ATIVAN  Replaced by:  LORazepam 2 MG/ML concentrated solution     oxyCODONE-acetaminophen 5-325 MG tablet  Commonly known as:  PERCOCET     pantoprazole 40 MG tablet  Commonly known as:  PROTONIX     STOOL SOFTENER PO      TAKE these medications        atropine 1 % ophthalmic solution  Place 2-4 drops under the tongue every 2 (two) hours as needed (upper airway secretions).     LORazepam 2 MG/ML concentrated solution  Commonly known as:  ATIVAN  Take 0.5 mLs (1 mg total) by mouth 2 (two) times daily.     LORazepam 2 MG/ML concentrated solution  Commonly known as:  ATIVAN  Take 0.5 mLs (1 mg total) by mouth every 2 (two) hours as needed for anxiety, seizure, sedation or sleep.     morphine CONCENTRATE 10 MG/0.5ML Soln concentrated solution  Take 1 mL (20  mg total) by mouth every hour as needed for moderate pain, severe pain, anxiety or shortness of breath.     morphine CONCENTRATE 10 MG/0.5ML Soln concentrated solution  Take 1 mL (20 mg total) by mouth every 4 (four) hours.     ondansetron 4 MG tablet  Commonly known as:  ZOFRAN  Take 1 tablet (4 mg total) by mouth every 6 (six) hours as needed for nausea.        Allergies  Allergen Reactions  . Bee Venom Anaphylaxis     Procedures/Studies:  Dg Chest 1 View  01/07/2016  CLINICAL DATA:  Lung cancer has spread to kidneys and intestines. EXAM: CHEST 1 VIEW COMPARISON:  10/20/2015 FINDINGS: Lungs are hyperexpanded. The lungs are clear wiithout focal pneumonia, edema, pneumothorax or pleural effusion. Interstitial markings are diffusely coarsened with chronic features. The cardiopericardial silhouette is within normal limits for size. The visualized bony structures of the thorax are intact. Telemetry leads overlie the chest. IMPRESSION: Hyperinflation with chronic interstitial coarsening suggests emphysema. No acute cardiopulmonary findings. Electronically Signed   By: Misty Stanley M.D.   On: 01/07/2016 10:01   Nm Gi Blood Loss  12/29/2015  CLINICAL DATA:  Evaluate GI bleed EXAM: NUCLEAR MEDICINE GASTROINTESTINAL BLEEDING SCAN TECHNIQUE: Sequential abdominal images were obtained following intravenous administration of Tc-93mlabeled red blood cells. RADIOPHARMACEUTICALS:  24.0 mCi Tc-941mn-vitro labeled red cells. COMPARISON:  None. FINDINGS: Dynamic imaging was performed for 120 minutes. The specific imaging features of active large bowel bleeding is not visualized. More apparent on the second hour are areas of increased radiotracer uptake within both sides of the abdomen which follow the expected course of the small bowel. IMPRESSION: Imaging findings favor a active small bowel bleed. On the study from 12/25/2015 the patient was noted to have multifocal small bowel lesions which may reflect  metastatic disease from patient's stage IV adenocarcinoma of the lung. Electronically Signed   By: TaKerby Moors.D.   On: 12/29/2015 19:05   Ct Abdomen Pelvis W Contrast  12/25/2015  CLINICAL DATA:  Mid to lower abdominal pain. History of lung cancer and GI bleed. Anemia. Blood in stools. EXAM: CT ABDOMEN AND PELVIS WITH CONTRAST TECHNIQUE: Multidetector CT imaging of the abdomen and pelvis was performed using the standard protocol following bolus administration of intravenous contrast. CONTRAST:  1001mSOVUE-300 IOPAMIDOL (ISOVUE-300) INJECTION 61% COMPARISON:  PET-CT 11/20/2015 FINDINGS: Lower chest: Stable 4 mm right middle lobe pulmonary nodule. No other pulmonary lesions. There are very small pleural effusions and overlying atelectasis. Hepatobiliary: No focal hepatic lesions to suggest metastatic disease. The gallbladder is partially contracted. A small gallstone is noted. No common bile duct dilatation. Pancreas: No mass, inflammation or ductal dilatation. Spleen: Linear defect in the upper aspect of the spleen is  most likely a this is new since the prior PET-CT. Splenic infarct. Adrenals/Urinary Tract: Stable bilateral adrenal gland lesions. The left adrenal gland lesion is likely an adenoma. The right adrenal gland is likely of metastasis based on the prior PET-CT. 2.8 cm enhancing lower pole right renal mass consistent with renal cell carcinoma. The left kidney is normal. Stomach/Bowel: The stomach is unremarkable. No obvious mass or ulcer. The duodenum appears normal. There appear to be numerous soft tissue lesions involving the small bowel and colon suspicious for metastasis. In retrospect I think the patchy [place] also. There is an acute inflammatory process between the proximal transverse colon and adjacent small bowel loop. This is most likely a a colonic process. It could be acute diverticulitis but more likely a metastatic lesion that bled. Vascular/Lymphatic: Advanced atherosclerotic  calcifications involving the aorta. No aneurysm or dissection. The major venous structures are patent. No mesenteric or retroperitoneal mass or adenopathy. Other: Small amount of free pelvic fluid. The bladder, prostate gland and seminal vesicles appear normal. No inguinal mass or adenopathy. Musculoskeletal: No significant bony findings. IMPRESSION: 1. Acute inflammatory process centered around the inferior margin of the proximal transverse colon and an adjacent loop of jejunum. Focal wall thickening of the colon is likely the epicenter and could be due to a metastasis that has hemorrhaged. Diverticulitis is felt to be unlikely. The adjacent small bilobed also appears normal and there could be a metastasis in this area also. Colonoscopy may be helpful for diagnostic purposes. Upper Endoscopy may also be helpful as there are lesions in the third portion the duodenum. 2. Diffuse small bowel lesions suspicious for diffuse metastasis. In retrospect I think these were positive on the PET scan. 3. 2.8 cm enhancing right renal mass most consistent with renal cell cancer. 4. Small bilateral adrenal gland lesions. The right lesion was hypermetabolic on the recent PET-CT. The left lesion is a benign adenoma. These results were called by telephone at the time of interpretation on 12/25/2015 at 3:51 pm to Dr. Wilford Corner , who verbally acknowledged these results. Electronically Signed   By: Marijo Sanes M.D.   On: 12/25/2015 15:51       Discharge Exam: Filed Vitals:   01/08/16 0150 01/08/16 0430  BP: 141/57 137/53  Pulse: 100 99  Temp: 98 F (36.7 C) 98.3 F (36.8 C)  Resp: 18 18   Filed Vitals:   01/07/16 2300 01/07/16 2327 01/08/16 0150 01/08/16 0430  BP: 137/71 135/54 141/57 137/53  Pulse: 101 100 100 99  Temp: 98.1 F (36.7 C) 98 F (36.7 C) 98 F (36.7 C) 98.3 F (36.8 C)  TempSrc: Oral Oral Oral Oral  Resp: '18 18 18 18  '$ Height:      Weight:      SpO2: 100% 100% 100% 100%    General: Pt  is alert, awake, not in acute distress Cardiovascular: RRR, S1/S2 +, no rubs, no gallops Respiratory: CTA bilaterally, no wheezing, no rhonchi Abdominal: Soft, NT, ND, bowel sounds + Extremities: no edema, no cyanosis    The results of significant diagnostics from this hospitalization (including imaging, microbiology, ancillary and laboratory) are listed below for reference.     Microbiology: No results found for this or any previous visit (from the past 240 hour(s)).   Labs: BNP (last 3 results) No results for input(s): BNP in the last 8760 hours. Basic Metabolic Panel:  Recent Labs Lab 01/04/16 1823 01/05/16 0711  NA 130* 131*  K 4.1 3.6  CL 99*  103  CO2 20* 23  GLUCOSE 112* 110*  BUN 26* 21*  CREATININE 0.63 0.45*  CALCIUM 7.6* 7.2*   Liver Function Tests:  Recent Labs Lab 01/05/16 0711 01/07/16 0916  AST 15 16  ALT 17 14*  ALKPHOS 140* 145*  BILITOT 1.6* 0.3  PROT 3.9* 3.9*  ALBUMIN 1.5* 1.3*   No results for input(s): LIPASE, AMYLASE in the last 168 hours. No results for input(s): AMMONIA in the last 168 hours. CBC:  Recent Labs Lab 01/04/16 1823 01/05/16 0711 01/07/16 0916  WBC 20.6* 16.3* 21.1*  NEUTROABS  --  12.7* 17.5*  HGB 5.0* 7.9* 4.1*  HCT 14.7* 22.5* 12.5*  MCV 90.7 87.2 91.2  PLT 651* 469* 665*   Cardiac Enzymes: No results for input(s): CKTOTAL, CKMB, CKMBINDEX, TROPONINI in the last 168 hours. BNP: Invalid input(s): POCBNP CBG: No results for input(s): GLUCAP in the last 168 hours. D-Dimer No results for input(s): DDIMER in the last 72 hours. Hgb A1c No results for input(s): HGBA1C in the last 72 hours. Lipid Profile No results for input(s): CHOL, HDL, LDLCALC, TRIG, CHOLHDL, LDLDIRECT in the last 72 hours. Thyroid function studies No results for input(s): TSH, T4TOTAL, T3FREE, THYROIDAB in the last 72 hours.  Invalid input(s): FREET3 Anemia work up No results for input(s): VITAMINB12, FOLATE, FERRITIN, TIBC, IRON,  RETICCTPCT in the last 72 hours. Urinalysis    Component Value Date/Time   COLORURINE YELLOW 01/07/2016 1041   APPEARANCEUR CLEAR 01/07/2016 1041   LABSPEC 1.017 01/07/2016 1041   PHURINE 5.5 01/07/2016 1041   GLUCOSEU NEGATIVE 01/07/2016 1041   HGBUR NEGATIVE 01/07/2016 1041   [place] 01/07/2016 [place] 01/07/2016 1041   PROTEINUR NEGATIVE 01/07/2016 1041   NITRITE NEGATIVE 01/07/2016 1041   LEUKOCYTESUR NEGATIVE 01/07/2016 1041   Sepsis Labs Invalid input(s): PROCALCITONIN,  WBC,  LACTICIDVEN Microbiology No results found for this or any previous visit (from the past 240 hour(s)).   Time coordinating discharge: Over 30 minutes  SIGNED:   Debbe Odea, MD  [facility] 01/08/2016, 10:47 AM Pager   If 7PM-7AM, please contact night-coverage [email or web address] Password [email or web address]

## 2016-01-08 NOTE — Progress Notes (Signed)
Radiation Oncology         [phone] ________________________________  Name: David Warren MRN: [id number]  Date: 01/07/2016  DOB: Mar 06, 1956  WJ:XBJY Lazarus Gowda, MD  No ref. provider found     REFERRING PHYSICIAN: No ref. provider found   DIAGNOSIS: The primary encounter diagnosis was Symptomatic anemia. Diagnoses of Acute blood loss anemia and Acute upper GI bleed were also pertinent to this visit.   HISTORY OF PRESENT ILLNESS: David Warren is a 60 y.o. male seen at the request of Dr. Erlinda Hong for a newly diagnosed with Stage IV, T3, N3, M1b  NSCLC, adenocarcinoma of the left upper and lower lobes which was identified in May 2017. He was found to have right paratracheal lymphadenopathy and right upper lobe pulmonary nodule adn right adrenal metastasis. He had been on systemic with carboplatin and alimta. He was admitted to the hospital between 12/23/15-12/31/15 and was identified as having an upper GI bleed. He has been admitted on several occasions due to this issue and has been enrolled in hospice. He was identified on tagged RBC scan and EGD as having metastatic disease within the upper small bowel. We are asked to see the patient for discussion of radiotherapy options.    PREVIOUS RADIATION THERAPY: No   PAST MEDICAL HISTORY:  Past Medical History  Diagnosis Date  . Tobacco abuse   . Lipoma of neck   . Hypertension   . Seizures ([place])     once several years ago  . Adenocarcinoma of left lung, stage 3 ([place]) 11/13/2015       PAST SURGICAL HISTORY: Past Surgical History  Procedure Laterality Date  . No past surgeries    . Electromagnetic navigation brochoscopy N/A 11/05/2015    Procedure: ELECTROMAGNETIC NAVIGATION BRONCHOSCOPY;  Surgeon: Flora Lipps, MD;  Location: [facility] ORS;  Service: Cardiopulmonary;  Laterality: N/A;  . Endobronchial ultrasound N/A 11/05/2015    Procedure: ENDOBRONCHIAL ULTRASOUND;  Surgeon: Flora Lipps, MD;  Location: [facility] ORS;  Service: Cardiopulmonary;   Laterality: N/A;  . Esophagogastroduodenoscopy (egd) with propofol N/A 12/24/2015    Procedure: ESOPHAGOGASTRODUODENOSCOPY (EGD) WITH PROPOFOL;  Surgeon: Wilford Corner, MD;  Location: [facility];  Service: Endoscopy;  Laterality: N/A;  . Givens capsule study N/A 12/27/2015    Procedure: GIVENS CAPSULE STUDY;  Surgeon: Wilford Corner, MD;  Location: [facility];  Service: Endoscopy;  Laterality: N/A;     FAMILY HISTORY:  Family History  Problem Relation Age of Onset  . Family history unknown: Yes     SOCIAL HISTORY:  reports that he quit smoking about 2 months ago. His smoking use included Cigarettes. He has a 35 pack-year smoking history. He has never used smokeless tobacco. He reports that he drinks about 7.2 oz of alcohol per week. He reports that he uses illicit drugs (Cocaine).   ALLERGIES: Bee venom   MEDICATIONS:  Current Facility-Administered Medications  Medication Dose Route Frequency Provider Last Rate Last Dose  . HYDROmorphone (DILAUDID) injection 0.5 mg  0.5 mg Intravenous Q4H Acquanetta Chain, DO   0.5 mg at 01/08/16 0347  . HYDROmorphone (DILAUDID) injection 0.5 mg  0.5 mg Intravenous Q2H PRN Acquanetta Chain, DO   0.5 mg at 01/07/16 1722  . sodium chloride flush (NS) 0.9 % injection 3 mL  3 mL Intravenous Q12H Florencia Reasons, MD   3 mL at 01/07/16 2200     REVIEW OF SYSTEMS: On review of systems, the patient is very somnolent and his daughter who speaks english and prefers to  be his interpreter states that he has been feeling better since receiving blood products. He is not having any abdominal pain currently. He denies nausea. No other complaints are verbalized.    PHYSICAL EXAM:  height is '5\' 6"'$  (1.676 m) and weight is 123 lb 1.6 oz (55.838 kg). His oral temperature is 98.3 F (36.8 C). His blood pressure is 137/53 and his pulse is 99. His respiration is 18 and oxygen saturation is 100%.   Pain scale 0/10 In general this is a chronically ill hispanic male in  no acute distress. He is alert and oriented x4 and appropriate throughout the examination. HEENT reveals that the patient is normocephalic, atraumatic. EOMs are intact. PERRLA. Skin is intact without any evidence of gross lesions. Cardiovascular exam reveals a regular rate and rhythm, no clicks rubs or murmurs are auscultated. Chest is clear to auscultation bilaterally. Lymphatic assessment is performed and does not reveal any adenopathy in the cervical, supraclavicular, axillary, or inguinal chains. Abdomen has active bowel sounds in all quadrants and is intact. The abdomen is soft, non tender, non distended. Lower extremities are negative for pretibial pitting edema, deep calf tenderness, cyanosis or clubbing.   ECOG = 4  0 - Asymptomatic (Fully active, able to carry on all predisease activities without restriction)  1 - Symptomatic but completely ambulatory (Restricted in physically strenuous activity but ambulatory and able to carry out work of a light or sedentary nature. For example, light housework, office work)  2 - Symptomatic, <50% in bed during the day (Ambulatory and capable of all self care but unable to carry out any work activities. Up and about more than 50% of waking hours)  3 - Symptomatic, >50% in bed, but not bedbound (Capable of only limited self-care, confined to bed or chair 50% or more of waking hours)  4 - Bedbound (Completely disabled. Cannot carry on any self-care. Totally confined to bed or chair)  5 - Death   Eustace Pen MM, Creech RH, Tormey DC, et al. [phone]). "Toxicity and response criteria of the [facility]". [place] Oncol. 5 (6): 649-55    LABORATORY DATA:  Lab Results  Component Value Date   WBC 21.1* 01/07/2016   HGB 4.1* 01/07/2016   HCT 12.5* 01/07/2016   MCV 91.2 01/07/2016   PLT 665* 01/07/2016   Lab Results  Component Value Date   NA 131* 01/05/2016   K 3.6 01/05/2016   CL 103 01/05/2016   CO2 23 01/05/2016   Lab  Results  Component Value Date   ALT 14* 01/07/2016   AST 16 01/07/2016   ALKPHOS 145* 01/07/2016   BILITOT 0.3 01/07/2016      RADIOGRAPHY: Dg Chest 1 View  01/07/2016  CLINICAL DATA:  Lung cancer has spread to kidneys and intestines. EXAM: CHEST 1 VIEW COMPARISON:  10/20/2015 FINDINGS: Lungs are hyperexpanded. The lungs are clear wiithout focal pneumonia, edema, pneumothorax or pleural effusion. Interstitial markings are diffusely coarsened with chronic features. The cardiopericardial silhouette is within normal limits for size. The visualized bony structures of the thorax are intact. Telemetry leads overlie the chest. IMPRESSION: Hyperinflation with chronic interstitial coarsening suggests emphysema. No acute cardiopulmonary findings. Electronically Signed   By: Misty Stanley M.D.   On: 01/07/2016 10:01   Nm Gi Blood Loss  12/29/2015  CLINICAL DATA:  Evaluate GI bleed EXAM: NUCLEAR MEDICINE GASTROINTESTINAL BLEEDING SCAN TECHNIQUE: Sequential abdominal images were obtained following intravenous administration of Tc-1mlabeled red blood cells. RADIOPHARMACEUTICALS:  24.0 mCi  Tc-88min-vitro labeled red cells. COMPARISON:  None. FINDINGS: Dynamic imaging was performed for 120 minutes. The specific imaging features of active large bowel bleeding is not visualized. More apparent on the second hour are areas of increased radiotracer uptake within both sides of the abdomen which follow the expected course of the small bowel. IMPRESSION: Imaging findings favor a active small bowel bleed. On the study from 12/25/2015 the patient was noted to have multifocal small bowel lesions which may reflect metastatic disease from patient's stage IV adenocarcinoma of the lung. Electronically Signed   By: TKerby MoorsM.D.   On: 12/29/2015 19:05   Ct Abdomen Pelvis W Contrast  12/25/2015  CLINICAL DATA:  Mid to lower abdominal pain. History of lung cancer and GI bleed. Anemia. Blood in stools. EXAM: CT ABDOMEN AND  PELVIS WITH CONTRAST TECHNIQUE: Multidetector CT imaging of the abdomen and pelvis was performed using the standard protocol following bolus administration of intravenous contrast. CONTRAST:  1022mISOVUE-300 IOPAMIDOL (ISOVUE-300) INJECTION 61% COMPARISON:  PET-CT 11/20/2015 FINDINGS: Lower chest: Stable 4 mm right middle lobe pulmonary nodule. No other pulmonary lesions. There are very small pleural effusions and overlying atelectasis. Hepatobiliary: No focal hepatic lesions to suggest metastatic disease. The gallbladder is partially contracted. A small gallstone is noted. No common bile duct dilatation. Pancreas: No mass, inflammation or ductal dilatation. Spleen: Linear defect in the upper aspect of the spleen is most likely a this is new since the prior PET-CT. Splenic infarct. Adrenals/Urinary Tract: Stable bilateral adrenal gland lesions. The left adrenal gland lesion is likely an adenoma. The right adrenal gland is likely of metastasis based on the prior PET-CT. 2.8 cm enhancing lower pole right renal mass consistent with renal cell carcinoma. The left kidney is normal. Stomach/Bowel: The stomach is unremarkable. No obvious mass or ulcer. The duodenum appears normal. There appear to be numerous soft tissue lesions involving the small bowel and colon suspicious for metastasis. In retrospect I think the patchy [place] also. There is an acute inflammatory process between the proximal transverse colon and adjacent small bowel loop. This is most likely a a colonic process. It could be acute diverticulitis but more likely a metastatic lesion that bled. Vascular/Lymphatic: Advanced atherosclerotic calcifications involving the aorta. No aneurysm or dissection. The major venous structures are patent. No mesenteric or retroperitoneal mass or adenopathy. Other: Small amount of free pelvic fluid. The bladder, prostate gland and seminal vesicles appear normal. No inguinal mass or adenopathy. Musculoskeletal: No significant  bony findings. IMPRESSION: 1. Acute inflammatory process centered around the inferior margin of the proximal transverse colon and an adjacent loop of jejunum. Focal wall thickening of the colon is likely the epicenter and could be due to a metastasis that has hemorrhaged. Diverticulitis is felt to be unlikely. The adjacent small bilobed also appears normal and there could be a metastasis in this area also. Colonoscopy may be helpful for diagnostic purposes. Upper Endoscopy may also be helpful as there are lesions in the third portion the duodenum. 2. Diffuse small bowel lesions suspicious for diffuse metastasis. In retrospect I think these were positive on the PET scan. 3. 2.8 cm enhancing right renal mass most consistent with renal cell cancer. 4. Small bilateral adrenal gland lesions. The right lesion was hypermetabolic on the recent PET-CT. The left lesion is a benign adenoma. These results were called by telephone at the time of interpretation on 12/25/2015 at 3:51 pm to Dr. VIWilford Corner who verbally acknowledged these results. Electronically Signed  By: Marijo Sanes M.D.   On: 12/25/2015 15:51       IMPRESSION:  Stage IV NSCLC with metastatic disease to the small bowel resulting in symptomatic anemia   PLAN: Dr. Lisbeth Renshaw has personally reviewed his films, previous workup, and recent hospitalizations. I communicated with the family Dr. Lisbeth Renshaw does not feel that there is a strong indication for radiotherapy at this time, and that to consider this would be high in treatment toxicity due to such a large field that would need to be treated, with low yield given his disease status and clinical status. They state agreement and understanding. He will continue to follow up with Hospice care.   The above documentation reflects my direct findings during this shared patient visit. Please see the separate note by Dr. Lisbeth Renshaw on this date for the remainder of the patient's plan of care.    Carola Rhine,  PAC

## 2016-01-08 NOTE — Progress Notes (Signed)
[facility] LCSW note: Pt admitted to hospice homecare program a week ago; RN and LCSW follow at home. Met with team here at hospital: Pt is DNR, wants to be able to continue to smoke, has adamantly refused [facility] several times. Pt/family told transfusions futile from here. Discussed 60 year old grandson in the home and measures necessary to prevent traumatizing him in the event of sudden bleeding episode. Family state awareness and that they will protect the child. Advised them to obtain dark towels to have in the home. Primary RN Kisha Scott notified of d/c today; weekend oncall RN and LCSW will  make visits; this LCSW will continue to follow at home if he survives the weekend. Pt/family told Pt has prognosis of days. Pt mostly flat, somewhat irritable, states wish to go home. °

## 2016-01-08 NOTE — Progress Notes (Signed)
Nutrition Brief Note  Chart reviewed. Pt now transitioning to comfort care.  No further nutrition interventions warranted at this time.  Please re-consult as needed.   Satira Anis. Deja Pisarski, MS, RD LDN Inpatient Clinical Dietitian Pager [phone]

## 2016-01-08 NOTE — Progress Notes (Signed)
Provided assistance with discharge medications and symptom manegment recommendations. Plan is for discharge home with hospice.  Morphine SL Roxanol '20mg'$  q4 hours scheduled and the q1 PRN for uncontrolled symptoms Ativan '1mg'$  BID and q2 PRN for agitation  Atropine in case he develops secretions  Full comfort care.[facility] managing goals and discharge.  Lane Hacker, DO Palliative Medicine

## 2016-01-08 NOTE — Progress Notes (Signed)
Pt is active with [facility]. Alinda Dooms, SW was here with her director to see pt.  Medical team:MD, Staff RN, Pharm D, and CM rounded on pt this morning concerning Hospice Home or home with Hospice. Pt's wife and daughter at bedside.  Pt continues to state he is going home.  Lelon Frohlich, SW  states, [facility] will send a nurse in.  MD explained to the pt, wife and daughter that there is nothing that can be done for the pt at this present time and no more blood transfusions.  Pt can transition to [facility] if he does not smoke and select to go.

## 2016-01-13 ENCOUNTER — Ambulatory Visit: Payer: Self-pay

## 2016-01-13 ENCOUNTER — Ambulatory Visit: Payer: Self-pay | Admitting: Nurse Practitioner

## 2016-01-13 ENCOUNTER — Telehealth: Payer: Self-pay

## 2016-01-13 ENCOUNTER — Other Ambulatory Visit: Payer: Self-pay

## 2016-01-13 ENCOUNTER — Emergency Department (HOSPITAL_COMMUNITY)
Admission: EM | Admit: 2016-01-13 | Discharge: 2016-01-13 | Disposition: A | Discharge status: Home / Self Care | Payer: No Typology Code available for payment source | Attending: Emergency Medicine | Admitting: Emergency Medicine

## 2016-01-13 ENCOUNTER — Encounter (HOSPITAL_COMMUNITY): Payer: Self-pay

## 2016-01-13 DIAGNOSIS — Z7189 Other specified counseling: Secondary | ICD-10-CM

## 2016-01-13 DIAGNOSIS — Z515 Encounter for palliative care: Secondary | ICD-10-CM | POA: Insufficient documentation

## 2016-01-13 DIAGNOSIS — I10 Essential (primary) hypertension: Secondary | ICD-10-CM | POA: Insufficient documentation

## 2016-01-13 DIAGNOSIS — C784 Secondary malignant neoplasm of small intestine: Secondary | ICD-10-CM | POA: Insufficient documentation

## 2016-01-13 DIAGNOSIS — Z87891 Personal history of nicotine dependence: Secondary | ICD-10-CM | POA: Insufficient documentation

## 2016-01-13 DIAGNOSIS — C3490 Malignant neoplasm of unspecified part of unspecified bronchus or lung: Secondary | ICD-10-CM | POA: Insufficient documentation

## 2016-01-13 DIAGNOSIS — D630 Anemia in neoplastic disease: Secondary | ICD-10-CM | POA: Insufficient documentation

## 2016-01-13 LAB — COMPREHENSIVE METABOLIC PANEL
ALK PHOS: 162 U/L — AB (ref 38–126)
ALT: 15 U/L — AB (ref 17–63)
ANION GAP: 9 (ref 5–15)
AST: 24 U/L (ref 15–41)
Albumin: 1 g/dL — ABNORMAL LOW (ref 3.5–5.0)
BUN: 38 mg/dL — ABNORMAL HIGH (ref 6–20)
CALCIUM: 7.3 mg/dL — AB (ref 8.9–10.3)
CHLORIDE: 103 mmol/L (ref 101–111)
CO2: 19 mmol/L — ABNORMAL LOW (ref 22–32)
CREATININE: 0.87 mg/dL (ref 0.61–1.24)
Glucose, Bld: 125 mg/dL — ABNORMAL HIGH (ref 65–99)
Potassium: 3.8 mmol/L (ref 3.5–5.1)
Sodium: 131 mmol/L — ABNORMAL LOW (ref 135–145)
Total Bilirubin: 0.5 mg/dL (ref 0.3–1.2)
Total Protein: 3.8 g/dL — ABNORMAL LOW (ref 6.5–8.1)

## 2016-01-13 LAB — CBC
HCT: 15.2 % — ABNORMAL LOW (ref 39.0–52.0)
Hemoglobin: 5 g/dL — CL (ref 13.0–17.0)
MCH: 29.8 pg (ref 26.0–34.0)
MCHC: 32.9 g/dL (ref 30.0–36.0)
MCV: 90.5 fL (ref 78.0–100.0)
PLATELETS: 537 10*3/uL — AB (ref 150–400)
RBC: 1.68 MIL/uL — AB (ref 4.22–5.81)
RDW: 14.5 % (ref 11.5–15.5)
WBC: 35.4 10*3/uL — AB (ref 4.0–10.5)

## 2016-01-13 LAB — POC OCCULT BLOOD, ED: Fecal Occult Bld: NEGATIVE

## 2016-01-13 LAB — ABO/RH: ABO/RH(D): AB POS

## 2016-01-13 LAB — PREPARE RBC (CROSSMATCH)

## 2016-01-13 MED ORDER — SODIUM CHLORIDE 0.9 % IV BOLUS (SEPSIS)
500.0000 mL | Freq: Once | INTRAVENOUS | Status: AC
  Administered 2016-01-13: 500 mL via INTRAVENOUS

## 2016-01-13 MED ORDER — SODIUM CHLORIDE 0.9 % IV SOLN
10.0000 mL/h | Freq: Once | INTRAVENOUS | Status: AC
  Administered 2016-01-13: 10 mL/h via INTRAVENOUS

## 2016-01-13 NOTE — ED Provider Notes (Signed)
Emergency Department Provider Note  Time seen: Approximately 1:16 PM  I have reviewed the triage vital signs and the nursing notes.   HISTORY  Chief Complaint Weakness   HPI David Warren is a 60 y.o. male with PMH of adenocarcinoma currently on home hospice with multiple mets to small bowel known to be chronically bleeding. The patient has required multiple blood transfusions in the last 2 weeks. He presents today with fatigue, lightheadedness, baseline abdominal discomfort. He continues to have black stools. Family at bedside state that they are aware that the patient is very sick. He is on hospice. They would like for him to receive an additional blood transfusion and discussed this with their oncologist prior to ED presentation. The patient was referred to the emergency department for acute evaluation.  He denies any fever, chills, chest pain, or difficulty breathing.   Past Medical History  Diagnosis Date  . Tobacco abuse   . Lipoma of neck   . Hypertension   . Seizures ([place])     once several years ago  . Adenocarcinoma of left lung, stage 3 ([place]) 11/13/2015    Patient Active Problem List   Diagnosis Date Noted  . Lower GI bleed 01/08/2016  . UGI bleed 01/05/2016  . Encounter for palliative care   . Symptomatic anemia 01/04/2016  . Leukocytosis 01/04/2016  . Palliative care encounter   . Goals of care, counseling/discussion   . Encounter for hospice care discussion   . Iron deficiency anemia 12/23/2015  . Acute blood loss anemia 12/23/2015  . Chronic fatigue 12/05/2015  . Adenocarcinoma of left lung, stage 4 ([place]) 11/13/2015  . Adenopathy   . Lung mass   . Preop cardiovascular exam 11/03/2015  . Aortic atherosclerosis ([place]) 11/03/2015  . CAD (coronary artery disease) 11/03/2015  . Tobacco abuse 10/23/2015  . Pulmonary nodules/lesions, multiple 10/23/2015  . Hemoptysis 10/23/2015    Past Surgical History  Procedure Laterality Date  . No past surgeries    .  Electromagnetic navigation brochoscopy N/A 11/05/2015    Procedure: ELECTROMAGNETIC NAVIGATION BRONCHOSCOPY;  Surgeon: Flora Lipps, MD;  Location: [facility] ORS;  Service: Cardiopulmonary;  Laterality: N/A;  . Endobronchial ultrasound N/A 11/05/2015    Procedure: ENDOBRONCHIAL ULTRASOUND;  Surgeon: Flora Lipps, MD;  Location: [facility] ORS;  Service: Cardiopulmonary;  Laterality: N/A;  . Esophagogastroduodenoscopy (egd) with propofol N/A 12/24/2015    Procedure: ESOPHAGOGASTRODUODENOSCOPY (EGD) WITH PROPOFOL;  Surgeon: Wilford Corner, MD;  Location: [facility] ENDOSCOPY;  Service: Endoscopy;  Laterality: N/A;  . Givens capsule study N/A 12/27/2015    Procedure: GIVENS CAPSULE STUDY;  Surgeon: Wilford Corner, MD;  Location: [facility] ENDOSCOPY;  Service: Endoscopy;  Laterality: N/A;    Current Outpatient Rx  Name  Route  Sig  Dispense  Refill  . atropine 1 % ophthalmic solution   Sublingual   Place 2-4 drops under the tongue every 2 (two) hours as needed (upper airway secretions).   2 mL   12   . LORazepam (ATIVAN) 2 MG/ML concentrated solution   Oral   Take 0.5 mLs (1 mg total) by mouth 2 (two) times daily.   30 mL   0   . LORazepam (ATIVAN) 2 MG/ML concentrated solution   Oral   Take 0.5 mLs (1 mg total) by mouth every 2 (two) hours as needed for anxiety, seizure, sedation or sleep.   30 mL   0     Hospice   . Morphine Sulfate (MORPHINE CONCENTRATE) 10 MG/0.5ML SOLN concentrated solution  Oral   Take 1 mL (20 mg total) by mouth every hour as needed for moderate pain, severe pain, anxiety or shortness of breath.   30 mL   0     Hospice   . Morphine Sulfate (MORPHINE CONCENTRATE) 10 MG/0.5ML SOLN concentrated solution   Oral   Take 1 mL (20 mg total) by mouth every 4 (four) hours.   30 mL   0     Hospice   . ondansetron (ZOFRAN) 4 MG tablet   Oral   Take 1 tablet (4 mg total) by mouth every 6 (six) hours as needed for nausea.   20 tablet   0     Allergies Bee venom  Family History    Problem Relation Age of Onset  . Family history unknown: Yes    Social History Social History  Substance Use Topics  . Smoking status: Former Smoker -- 1.00 packs/day for 35 years    Types: Cigarettes    Quit date: 10/30/2015  . Smokeless tobacco: Never Used  . Alcohol Use: 7.2 oz/week    12 Cans of beer per week     Comment: 2-6 cans beer daily, last drink yesterday    Review of Systems  Constitutional: No fever/chills; Positive fatigue and generalized weakness.  Eyes: No visual changes. ENT: No sore throat. Cardiovascular: Denies chest pain. Respiratory: Denies shortness of breath. Gastrointestinal: No abdominal pain.  No nausea, no vomiting.  No diarrhea.  No constipation. Genitourinary: Negative for dysuria. Musculoskeletal: Negative for back pain. Skin: Negative for rash. Neurological: Negative for headaches, focal weakness or numbness.  10-point ROS otherwise negative.  ____________________________________________   PHYSICAL EXAM:  VITAL SIGNS: ED Triage Vitals  Enc Vitals Group     BP 01/13/16 1211 111/56 mmHg     Pulse Rate 01/13/16 1211 126     Resp 01/13/16 1211 22     Temp 01/13/16 1211 98.3 F (36.8 C)     SpO2 01/13/16 1211 78 %     Weight 01/13/16 1211 123 lb (55.792 kg)     Pain Score 01/13/16 1209 8   Constitutional: Alert and oriented. Thin and chronically ill appearing.  Eyes: Conjunctivae are normal. PERRL. EOMI. Head: Atraumatic. Nose: No congestion/rhinnorhea. Mouth/Throat: Mucous membranes are dry. Oropharynx non-erythematous. Neck: No stridor.   Cardiovascular: Tachycardia. Good peripheral circulation. Grossly normal heart sounds.   Respiratory: Normal respiratory effort.  No retractions. Lungs CTAB. Gastrointestinal: Soft with mild diffuse tenderness. No rebound or guarding. No distention.  Musculoskeletal: No lower extremity tenderness nor edema. No gross deformities of extremities. Neurologic:  Normal speech and language. No gross  focal neurologic deficits are appreciated.  Skin:  Skin is warm, dry and intact. No rash noted. Psychiatric: Mood and affect are normal. Speech and behavior are normal.  ____________________________________________   LABS (all labs ordered are listed, but only abnormal results are displayed)  Labs Reviewed  COMPREHENSIVE METABOLIC PANEL - Abnormal; Notable for the following:    Sodium 131 (*)    CO2 19 (*)    Glucose, Bld 125 (*)    BUN 38 (*)    Calcium 7.3 (*)    Total Protein 3.8 (*)    Albumin 1.0 (*)    ALT 15 (*)    Alkaline Phosphatase 162 (*)    All other components within normal limits  CBC - Abnormal; Notable for the following:    WBC 35.4 (*)    RBC 1.68 (*)    Hemoglobin 5.0 (*)  HCT 15.2 (*)    Platelets 537 (*)    All other components within normal limits  POC OCCULT BLOOD, ED  TYPE AND SCREEN  ABO/RH  PREPARE RBC (CROSSMATCH)   ____________________________________________   PROCEDURES  Procedure(s) performed:   Procedures  CRITICAL CARE Performed by: Margette Fast Total critical care time: 35 minutes Critical care time was exclusive of separately billable procedures and treating other patients. Critical care was necessary to treat or prevent imminent or life-threatening deterioration. Critical care was time spent personally by me on the following activities: development of treatment plan with patient and/or surrogate as well as nursing, discussions with consultants, evaluation of patient's response to treatment, examination of patient, obtaining history from patient or surrogate, ordering and performing treatments and interventions, ordering and review of laboratory studies, ordering and review of radiographic studies, pulse oximetry and re-evaluation of patient's condition.  Nanda Quinton, MD Emergency Medicine ____________________________________________   INITIAL IMPRESSION / ASSESSMENT AND PLAN / ED COURSE  Pertinent labs & imaging results  that were available during my care of the patient were reviewed by me and considered in my medical decision making (see chart for details).  Patient presents to the emergency department for evaluation of generalized weakness and fatigue in the setting of known metastatic lesions to the small bowel with chronic bleeding. Suspect Continuation of the patient's chronic GI bleeding. In review of the discharge summary from recent admission the patient was evaluated by palliative care and the decision was made not to offer additional blood transfusions. The family does not seem to have this idea and are requesting additional blood transfusions in the emergency department. Patient with hypotension on repeat vitals. Will treat with IVF bolus. Will check labs and reassess. Plan to discuss with patient's oncologist.   02:17 PM Repeat Hb is 5.0. Updated patient and family. They are in agreement that he cannot receive unlimited transfusions but they are requesting one additional transfusion to by time to have family get into town for his passing and to help him get his final affairs in order.  I feel this is a reasonable plan and will ultimately allow for him to return home and pass surrounded by his family. Family understand that this may not give significant additional time and that with multiple recent blood transfusions he could have a transfusion reaction or other adverse event that could paradoxically hasten his death. We discussed this is detail but the patient and family would like to proceed with 1 U PRBC and discharge home to be with loved ones.  ____________________________________________  FINAL CLINICAL IMPRESSION(S) / ED DIAGNOSES  Final diagnoses:  Anemia in neoplastic disease  Encounter for palliative care  Goals of care, counseling/discussion     MEDICATIONS GIVEN DURING THIS VISIT:  Medications  0.9 %  sodium chloride infusion (10 mL/hr Intravenous New Bag/Given 01/13/16 1716)  sodium  chloride 0.9 % bolus 500 mL (0 mLs Intravenous Stopped 01/13/16 1745)   1U PRBC transfusion   NEW OUTPATIENT MEDICATIONS STARTED DURING THIS VISIT:  None   Note:  This document was prepared using Dragon voice recognition software and may include unintentional dictation errors.  Nanda Quinton, MD Emergency Medicine  Margette Fast, MD 01/13/16 [phone]

## 2016-01-13 NOTE — Discharge Instructions (Signed)
You were seen in the ED today with blood loss and bleeding from the GI tract. We gave an additional unit of blood to make you more comfortable. We appreciate that this is an extremely difficult time but we have no additional therapy to offer at this time. Discuss your goals and ED visit with your hospice nurses.  [place] (About Your Loved One's Last Days) [place] MI SER QUERIDO? Hable con su ser querido sobre la forma en que le gustara recibir atencin durante los ltimos das de [place] vida. Las opciones de atencin pueden incluir lo siguiente:  Multimedia programmer. Muchas personas eligen morir en su casa o en la casa de un familiar. Esto puede requerir [employer] familiares asuman el papel de cuidadores.  Cuidados paliativos. Los cuidados paliativos son un servicio diseado para brindar apoyo mdico, espiritual y psicolgico a los enfermos terminales, y a Education officer, community. Se trata de un servicio que se puede brindar en diferentes mbitos, incluso en el hogar.  Atencin hospitalaria. Algunas personas prefieren la comodidad de tener enfermeros y mdicos cerca en todo momento.  Atencin en un hogar de ancianos. Los hogares de ancianos cuentan con personal mdico de guardia en todo momento.  Atencin espiritual. Es posible incluir un sacerdote, un pastor o un lder espiritual como miembro del equipo de cuidados para que brinden orientacin y Games developer. Puede optar por combinar varias opciones de atencin. QU CAMBIOS OBSERVAR EN MI SER QUERIDO? Su ser querido Merchandiser, retail algunos cambios cuando se acerque el momento de la [place]. Puede notar que su ser querido:  Duerme ms tiempo.  Orina y defeca menos.  Tiene mucosidad en la boca.  Est ms fro al tacto y tiene un color ms azulado.  Est inquieto y se comporta de un modo que no es el habitual. Por ejemplo, puede tironear las sbanas, hablar con personas que no estn en la  habitacin o con gente ya fallecida.  Tiene cambios en la respiracin. La respiracin puede ser menos profunda o volverse ms profunda y parecerse a un ronquido. Pueden pasar hasta 30segundos entre Ardelia Mems respiracin y [place]. Los cambios en la respiracin pueden ir y Contractor. [place]? A continuacin de detallan algunas cosas que puede hacer para que su ser querido est cmodo:  Mantenga una luz encendida en la habitacin de su ser querido, ya que tal vez no pueda ver bien.  Espere hasta que su ser querido se despierte para administrarle cualquier medicamento que deba tomar.  No obligue a su ser querido a comer, beber o [employer].  Si su ser querido tiene mucosidad espesa en la garganta, tal vez sea til elevarle la cabeza y los hombros con [place].  Coloque compresas higinicas debajo de su ser querido y Animal nutritionist cuando sea necesario para mantenerlas limpias y secas.  Si su ser querido habla con personas que no estn all, no intente corregirlo.  Consuele a su ser querido Equities trader.  Llame a un mdico si su ser querido dice querer Garment/textile technologist y no Restaurant manager, fast food.  Mantenga hmeda la boca de su ser querido para que est cmodo. Para esto, puede hacer lo siguiente:  Ofrecerle cubitos de hielo, si an Psychologist, prison and probation services.  Ponerle vaselina en los labios para evitar que se sequen demasiado.  Limpiarle la boca suavemente con esponjas bucales hmedas. El mdico puede suministrarle estas esponjas. QU OCURRE EN EL MOMENTO DE [place]? En el momento de  la muerte:  La respiracin se detiene.  El corazn deja de Engineering geologist.  Es posible que los ojos estn parcialmente abiertos.  La boca puede estar apenas abierta. Todas son manifestaciones naturales. Los sentimientos de duelo o [place] son normales en este momento. Hable con el equipo de cuidados si tiene preguntas o alguna necesidad.   Esta informacin no tiene Marine scientist el consejo del mdico.  Asegrese de hacerle al mdico cualquier pregunta que tenga.   Document Released: 09/28/2010 Document Revised: 10/28/2014 Elsevier Interactive Patient Education [employer].

## 2016-01-13 NOTE — ED Notes (Signed)
Pt conjunctiva and gums are very pale.

## 2016-01-13 NOTE — Telephone Encounter (Signed)
Called to follow up with patient after missing appt today. Spoke with patients daughter who states pt is on Hospice now but requests pt comes in for blood today or tomorrow because HGB was still low when rechecked 2 days ago. Spoke with Dr. Julien Nordmann who states pt needs to go to the ED if HGB is still low to be accessed. patients daughter states pt was denied blood when it was checked 2 days ago through her hospice physician. Informed her per Dr. Julien Nordmann pt to be examined through ED if pt is symptomatic. Patients daughter verbalized understanding and will take patient to ED if he becomes symptomatic, denies any further questions at this time.

## 2016-01-13 NOTE — ED Notes (Addendum)
Patient here with 1 week of feeling weak and has noted dark stools x 3 days. Complains of generalized abdominal pain, no vomiting. Has lung ca with anemia. Oxygen sats 75% on arrival, denies shortness of breath, not currently receiving cancer treatment per family

## 2016-01-13 NOTE — ED Notes (Signed)
IV attempted x's 2 without success 

## 2016-01-13 NOTE — ED Notes (Signed)
Blood consent signed by pt for transfusion

## 2016-01-14 ENCOUNTER — Telehealth: Payer: Self-pay | Admitting: *Deleted

## 2016-01-14 LAB — TYPE AND SCREEN
ABO/RH(D): AB POS
Antibody Screen: NEGATIVE
Unit division: 0

## 2016-01-14 NOTE — Telephone Encounter (Signed)
Adria from interpreter services called asking if patient consult for 01/20/16, was going to be cancelled as patient is with hospice, checked that patsint was in the ED 01/13/16 for i unit prbc, IVF;'s, and that patient was going home to allow time for family from out of town to come and be with him  Per note before he passes, showed Dr. Sondra Come Dr. Fernande Bras, MD note  In Epic, we will cancel his consult, called Adria and cancelled interpreter services 10:09 AM  10:09 AM

## 2016-01-20 ENCOUNTER — Ambulatory Visit: Payer: No Typology Code available for payment source | Admitting: Radiation Oncology

## 2016-01-20 ENCOUNTER — Other Ambulatory Visit: Payer: Self-pay

## 2016-01-20 ENCOUNTER — Ambulatory Visit: Payer: No Typology Code available for payment source

## 2016-01-26 DEATH — deceased

## 2016-01-27 ENCOUNTER — Other Ambulatory Visit: Payer: Self-pay

## 2016-02-03 ENCOUNTER — Ambulatory Visit: Payer: Self-pay

## 2016-02-03 ENCOUNTER — Ambulatory Visit: Payer: Self-pay | Admitting: Internal Medicine

## 2016-02-03 ENCOUNTER — Other Ambulatory Visit: Payer: Self-pay

## 2016-02-10 ENCOUNTER — Other Ambulatory Visit: Payer: Self-pay

## 2016-02-12 NOTE — ED Provider Notes (Signed)
[facility] DEPT Provider Note   CSN: [id number] Arrival date & time: 01/07/16  0609     History   Chief Complaint Chief Complaint  Patient presents with  . Dizziness    HPI David Warren is a 60 y.o. male.  HPI   60 y/o make with h/o metastatic NSCLC with mets to GI tract who was discharged from the hospital two days ago to home with home hospice is brought back to the hospital due to continued melena, feeling dizzy , and c/o abdominal pain. Abd pain is intermittent and when present severe and in the upper quadrants. Currently patient is pain free. Normal bm and no emesis. Pt has been feeling dizzy, and at times felt like he might faint. No chest pain.   ROS 10 Systems reviewed and are negative for acute change except as noted in the HPI.     Past Medical History:  Diagnosis Date  . Adenocarcinoma of left lung, stage 3 ([place]) 11/13/2015  . Hypertension   . Lipoma of neck   . Seizures ([place])    once several years ago  . Tobacco abuse     Patient Active Problem List   Diagnosis Date Noted  . Lower GI bleed 01/08/2016  . UGI bleed 01/05/2016  . Encounter for palliative care   . Symptomatic anemia 01/04/2016  . Leukocytosis 01/04/2016  . Palliative care encounter   . Goals of care, counseling/discussion   . Encounter for hospice care discussion   . Iron deficiency anemia 12/23/2015  . Acute blood loss anemia 12/23/2015  . Chronic fatigue 12/05/2015  . Adenocarcinoma of left lung, stage 4 ([place]) 11/13/2015  . Adenopathy   . Lung mass   . Preop cardiovascular exam 11/03/2015  . Aortic atherosclerosis ([place]) 11/03/2015  . CAD (coronary artery disease) 11/03/2015  . Tobacco abuse 10/23/2015  . Pulmonary nodules/lesions, multiple 10/23/2015  . Hemoptysis 10/23/2015    Past Surgical History:  Procedure Laterality Date  . ELECTROMAGNETIC NAVIGATION BROCHOSCOPY N/A 11/05/2015   Procedure: ELECTROMAGNETIC NAVIGATION BRONCHOSCOPY;  Surgeon: Flora Lipps, MD;  Location:  [facility] ORS;  Service: Cardiopulmonary;  Laterality: N/A;  . ENDOBRONCHIAL ULTRASOUND N/A 11/05/2015   Procedure: ENDOBRONCHIAL ULTRASOUND;  Surgeon: Flora Lipps, MD;  Location: [facility] ORS;  Service: Cardiopulmonary;  Laterality: N/A;  . ESOPHAGOGASTRODUODENOSCOPY (EGD) WITH PROPOFOL N/A 12/24/2015   Procedure: ESOPHAGOGASTRODUODENOSCOPY (EGD) WITH PROPOFOL;  Surgeon: Wilford Corner, MD;  Location: [facility];  Service: Endoscopy;  Laterality: N/A;  . GIVENS CAPSULE STUDY N/A 12/27/2015   Procedure: GIVENS CAPSULE STUDY;  Surgeon: Wilford Corner, MD;  Location: [facility];  Service: Endoscopy;  Laterality: N/A;  . NO PAST SURGERIES         Home Medications    Prior to Admission medications   Medication Sig Start Date End Date Taking? Authorizing Provider  ondansetron (ZOFRAN) 4 MG tablet Take 1 tablet (4 mg total) by mouth every 6 (six) hours as needed for nausea. 12/31/15  Yes Belkys A Regalado, MD  atropine 1 % ophthalmic solution Place 2-4 drops under the tongue every 2 (two) hours as needed (upper airway secretions). 01/08/16   Acquanetta Chain, DO  LORazepam (ATIVAN) 2 MG/ML concentrated solution Take 0.5 mLs (1 mg total) by mouth 2 (two) times daily. 01/08/16   Acquanetta Chain, DO  LORazepam (ATIVAN) 2 MG/ML concentrated solution Take 0.5 mLs (1 mg total) by mouth every 2 (two) hours as needed for anxiety, seizure, sedation or sleep. 01/08/16   Acquanetta Chain, DO  Morphine  Sulfate (MORPHINE CONCENTRATE) 10 MG/0.5ML SOLN concentrated solution Take 1 mL (20 mg total) by mouth every hour as needed for moderate pain, severe pain, anxiety or shortness of breath. 01/08/16   Acquanetta Chain, DO  Morphine Sulfate (MORPHINE CONCENTRATE) 10 MG/0.5ML SOLN concentrated solution Take 1 mL (20 mg total) by mouth every 4 (four) hours. 01/08/16   Acquanetta Chain, DO    Family History Family History  Problem Relation Age of Onset  . Family history unknown: Yes    Social  History Social History  Substance Use Topics  . Smoking status: Former Smoker    Packs/day: 1.00    Years: 35.00    Types: Cigarettes    Quit date: 10/30/2015  . Smokeless tobacco: Never Used  . Alcohol use 7.2 oz/week    12 Cans of beer per week     Comment: 2-6 cans beer daily, last drink yesterday     Allergies   Bee venom   Review of Systems Review of Systems   Physical Exam Updated Vital Signs BP (!) 137/53 (BP Location: Left Arm)   Pulse 99   Temp 98.3 F (36.8 C) (Oral)   Resp 18   Ht '5\' 6"'$  (1.676 m)   Wt 123 lb 1.6 oz (55.8 kg)   SpO2 100%   BMI 19.87 kg/m   Physical Exam   ED Treatments / Results  Labs (all labs ordered are listed, but only abnormal results are displayed) Labs Reviewed  CBC WITH DIFFERENTIAL/PLATELET - Abnormal; Notable for the following:       Result Value   WBC 21.1 (*)    RBC 1.37 (*)    Hemoglobin 4.1 (*)    HCT 12.5 (*)    RDW 15.8 (*)    Platelets 665 (*)    Neutro Abs 17.5 (*)    Monocytes Absolute 2.5 (*)    All other components within normal limits  HEPATIC FUNCTION PANEL - Abnormal; Notable for the following:    Total Protein 3.9 (*)    Albumin 1.3 (*)    ALT 14 (*)    Alkaline Phosphatase 145 (*)    Indirect Bilirubin 0.1 (*)    All other components within normal limits  PROTIME-INR  URINALYSIS, ROUTINE W REFLEX MICROSCOPIC (NOT AT [facility])  I-STAT CHEM 8, ED  TYPE AND SCREEN  PREPARE RBC (CROSSMATCH)    EKG  EKG Interpretation None       Radiology No results found.  Procedures .Critical Care Performed by: Varney Biles Authorized by: Varney Biles   Critical care provider statement:    Critical care time (minutes):  40   Critical care time was exclusive of:  Separately billable procedures and treating other patients   Critical care was necessary to treat or prevent imminent or life-threatening deterioration of the following conditions:  Circulatory failure   Critical care was time spent  personally by me on the following activities:  Blood draw for specimens, development of treatment plan with patient or surrogate, discussions with consultants, examination of patient, evaluation of patient's response to treatment, obtaining history from patient or surrogate, ordering and performing treatments and interventions, ordering and review of laboratory studies, ordering and review of radiographic studies and review of old charts   (including critical care time)  Medications Ordered in ED Medications  sodium chloride 0.9 % bolus 1,000 mL (1,000 mLs Intravenous New Bag/Given 01/07/16 1139)  0.9 %  sodium chloride infusion (10 mL/hr Intravenous New Bag/Given 01/07/16 1000)  morphine  2 MG/ML injection (  Duplicate 02/04/90 4782)  LORazepam (ATIVAN) injection 1 mg (1 mg Intravenous Given 01/07/16 1721)  zolpidem (AMBIEN) tablet 5 mg (5 mg Oral Given 01/07/16 2336)     Initial Impression / Assessment and Plan / ED Course  I have reviewed the triage vital signs and the nursing notes.  Pertinent labs & imaging results that were available during my care of the patient were reviewed by me and considered in my medical decision making (see chart for details).  Clinical Course    Pt with hx of lung CA with mets to the Gi tract. Recent admission for anemia and GI bleed - and the current situation appears to be the same. We will admit. Start transfusion for the anemia that is symptomatic. Palliative care consulted.  Final Clinical Impressions(s) / ED Diagnoses   Final diagnoses:  Symptomatic anemia  Acute blood loss anemia  Acute upper GI bleed    New Prescriptions Discharge Medication List as of 01/08/2016 11:01 AM    START taking these medications   Details  atropine 1 % ophthalmic solution Place 2-4 drops under the tongue every 2 (two) hours as needed (upper airway secretions)., Starting 01/08/2016, Until Discontinued, Normal    !! LORazepam (ATIVAN) 2 MG/ML concentrated solution Take  0.5 mLs (1 mg total) by mouth 2 (two) times daily., Starting 01/08/2016, Until Discontinued, Print    !! LORazepam (ATIVAN) 2 MG/ML concentrated solution Take 0.5 mLs (1 mg total) by mouth every 2 (two) hours as needed for anxiety, seizure, sedation or sleep., Starting 01/08/2016, Until Discontinued, Print    !! Morphine Sulfate (MORPHINE CONCENTRATE) 10 MG/0.5ML SOLN concentrated solution Take 1 mL (20 mg total) by mouth every hour as needed for moderate pain, severe pain, anxiety or shortness of breath., Starting 01/08/2016, Until Discontinued, Print    !! Morphine Sulfate (MORPHINE CONCENTRATE) 10 MG/0.5ML SOLN concentrated solution Take 1 mL (20 mg total) by mouth every 4 (four) hours., Starting 01/08/2016, Until Discontinued, Normal     !! - Potential duplicate medications found. Please discuss with provider.       Varney Biles, MD 02/12/16 [phone]

## 2016-02-17 ENCOUNTER — Other Ambulatory Visit: Payer: Self-pay

## 2016-02-24 ENCOUNTER — Ambulatory Visit: Payer: Self-pay

## 2016-02-24 ENCOUNTER — Ambulatory Visit: Payer: Self-pay | Admitting: Internal Medicine

## 2016-02-24 ENCOUNTER — Other Ambulatory Visit: Payer: Self-pay

## 2016-06-08 NOTE — Telephone Encounter (Signed)
error 

## 2016-12-31 ENCOUNTER — Other Ambulatory Visit: Payer: Self-pay | Admitting: Nurse Practitioner

## 2017-10-16 IMAGING — CT CT ABD-PELV W/ CM
2 of 5 series · 15 of 46 positions shown, 17 images · IV contrast (ISOVUE)
Comparison: PET-CT 11/20/2015

CLINICAL DATA: Mid to lower abdominal pain. History of lung cancer
and GI bleed. Anemia. Blood in stools.

EXAM:
CT ABDOMEN AND PELVIS WITH CONTRAST
TECHNIQUE: Multidetector CT imaging of the abdomen and pelvis was performed
using the standard protocol following bolus administration of
intravenous contrast.
CONTRAST:  100mL L1DCKN-KCC IOPAMIDOL (L1DCKN-KCC) INJECTION 61%

[Series 2: rtn a/p with · axial · 0.72mm/px · z∈[-460,-50]mm · 12 of 96 slices shown, 14 images]
[im 7/96  soft-tissue]
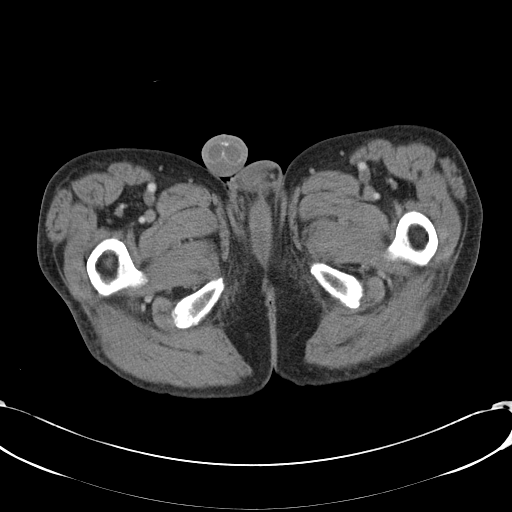
[im 7/96  bone]
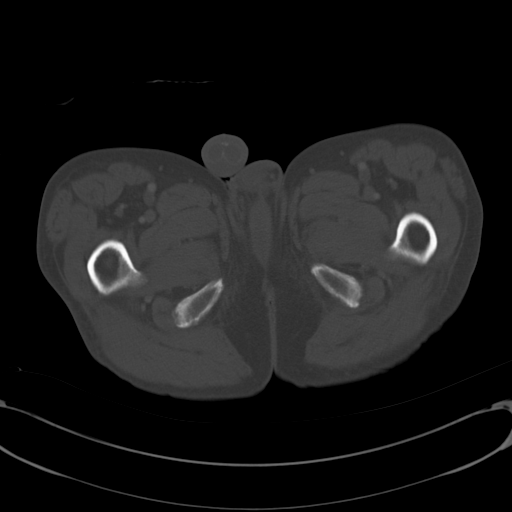
[im 14/96  soft-tissue]
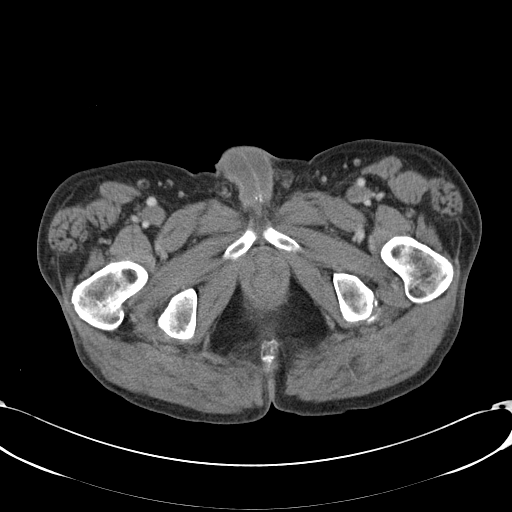
[im 21/96  soft-tissue]
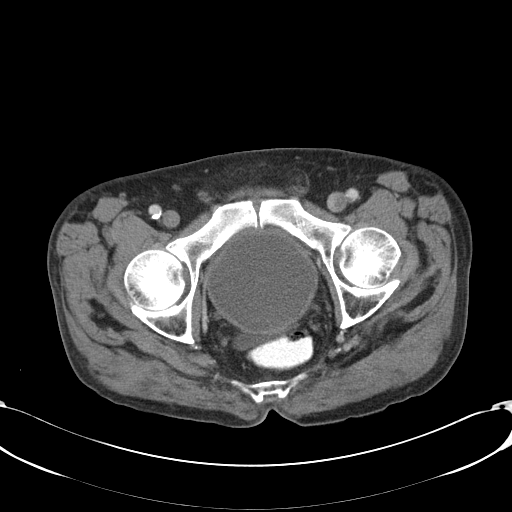
[im 28/96  soft-tissue]
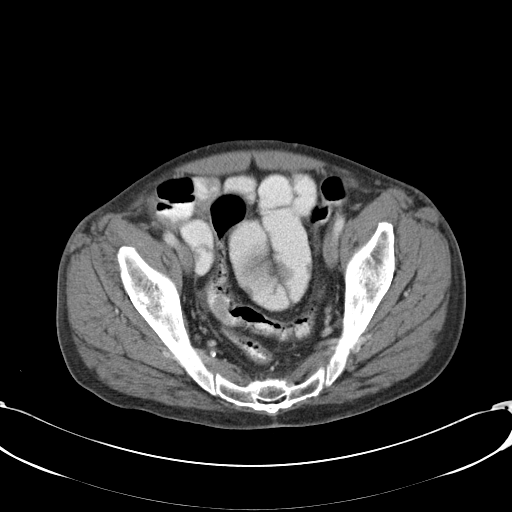
[im 34/96  soft-tissue]
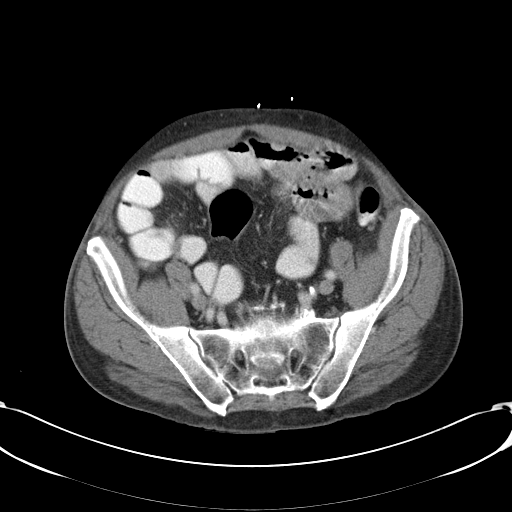
[im 41/96  soft-tissue]
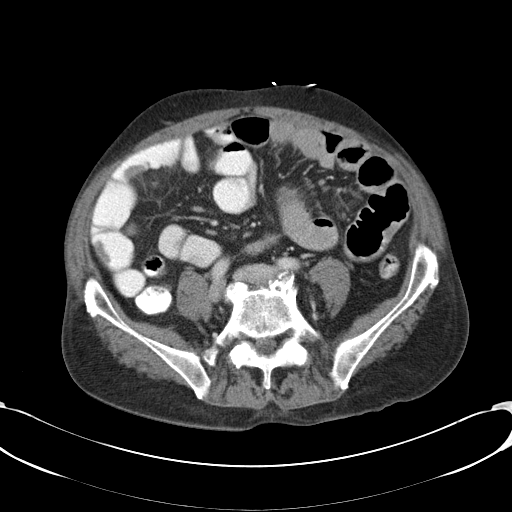
[im 55/96  soft-tissue]
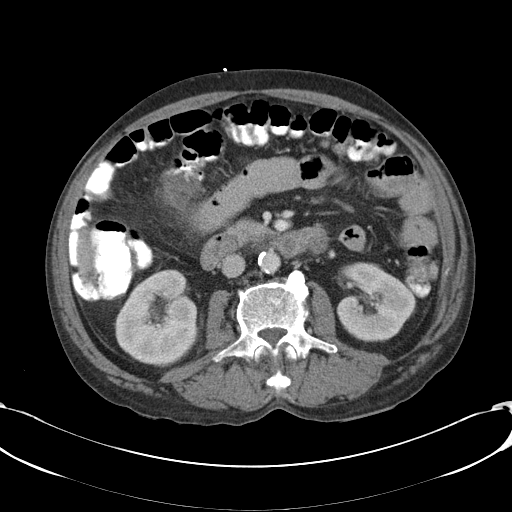
[im 62/96  soft-tissue]
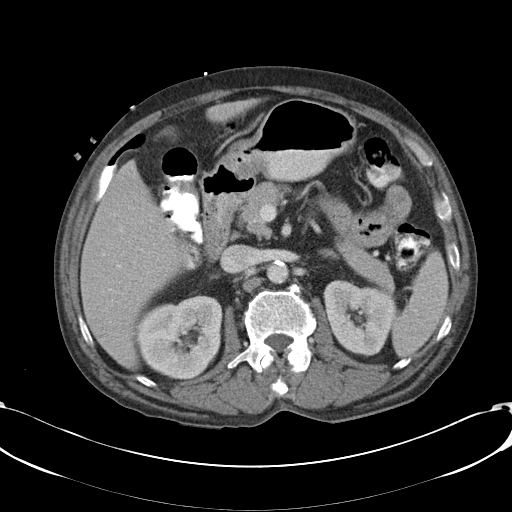
[im 68/96  soft-tissue]
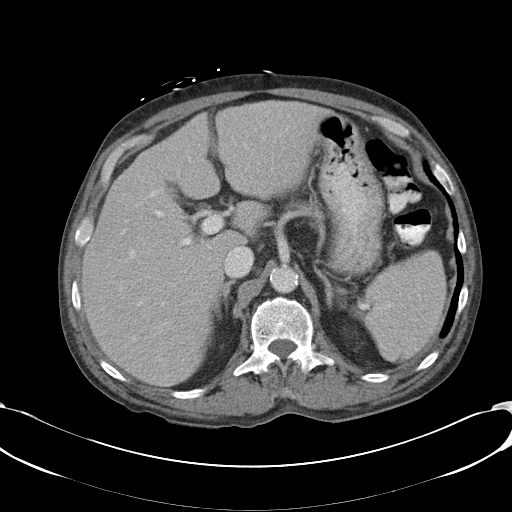
[im 68/96  bone]
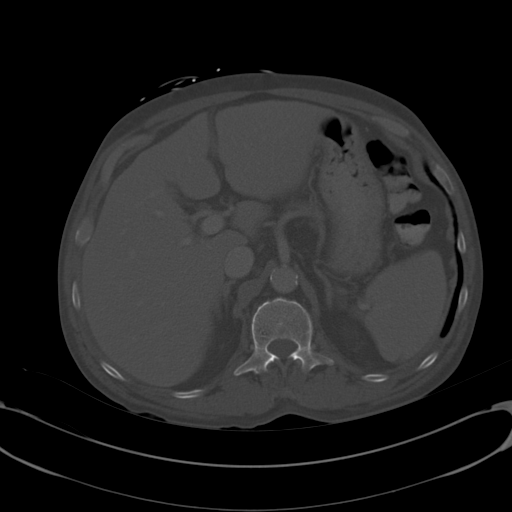
[im 75/96  soft-tissue]
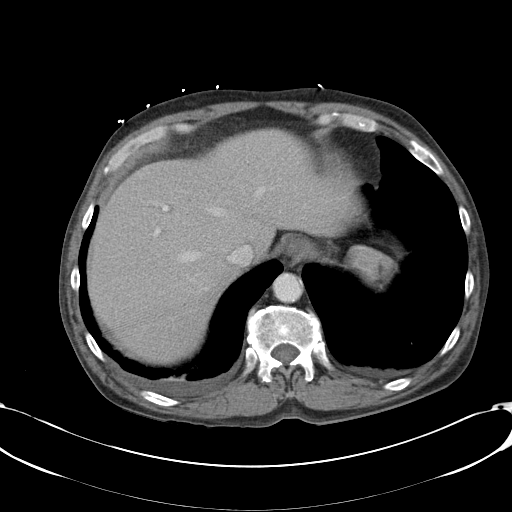
[im 82/96  soft-tissue]
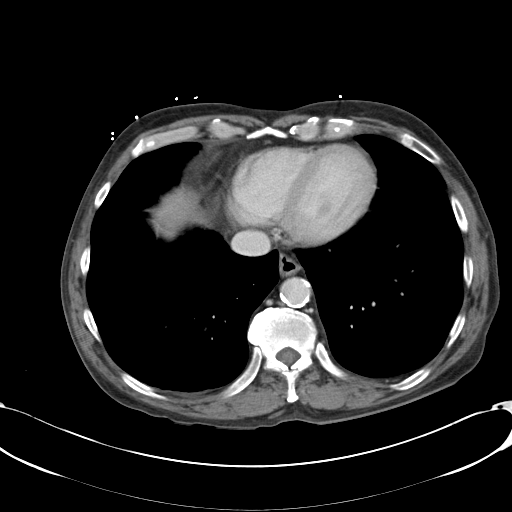
[im 89/96  soft-tissue]
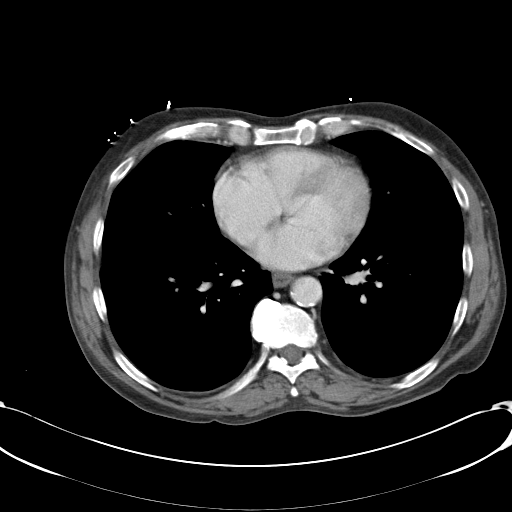

[Series 602: <mpr thick range> · coronal · 0.93mm/px · 3 of 144 slices shown]
[im 48/144  soft-tissue]
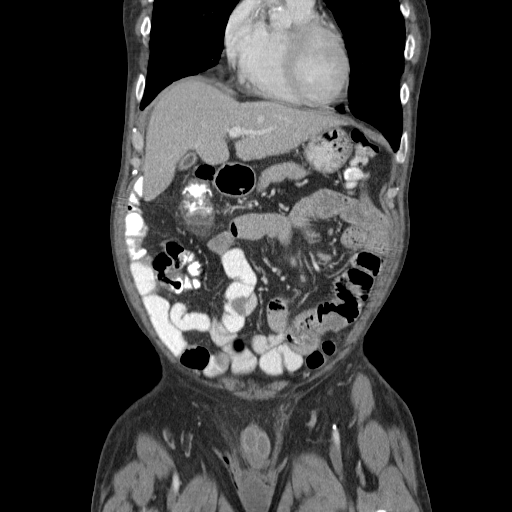
[im 64/144  soft-tissue]
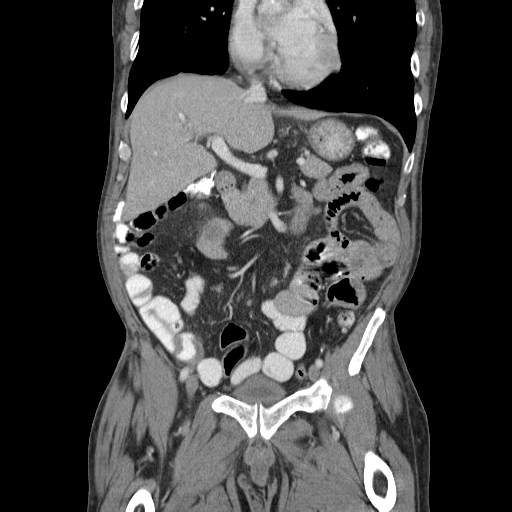
[im 80/144  soft-tissue]
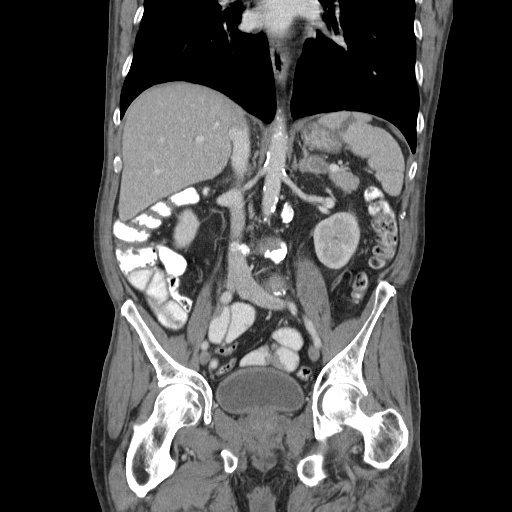

[15 of 46 positions shown; findings below may reference images not displayed]

FINDINGS: Lower chest: Stable 4 mm right middle lobe pulmonary nodule. No
other pulmonary lesions. There are very small pleural effusions and
overlying atelectasis.

Hepatobiliary: No focal hepatic lesions to suggest metastatic
disease. The gallbladder is partially contracted. A small gallstone
is noted. No common bile duct dilatation.

Pancreas: No mass, inflammation or ductal dilatation.

Spleen: Linear defect in the upper aspect of the spleen is most
likely a this is new since the prior PET-CT. Splenic infarct.

Adrenals/Urinary Tract: Stable bilateral adrenal gland lesions. The
left adrenal gland lesion is likely an adenoma. The right adrenal
gland is likely of metastasis based on the prior PET-CT.

2.8 cm enhancing lower pole right renal mass consistent with renal
cell carcinoma. The left kidney is normal.

Stomach/Bowel: The stomach is unremarkable. No obvious mass or
ulcer. The duodenum appears normal. There appear to be numerous soft
tissue lesions involving the small bowel and colon suspicious for
metastasis. In retrospect I think the patchy [place] also.

There is an acute inflammatory process between the proximal
transverse colon and adjacent small bowel loop. This is most likely
a a colonic process. It could be acute diverticulitis but more
likely a metastatic lesion that bled.

Vascular/Lymphatic: Advanced atherosclerotic calcifications
involving the aorta. No aneurysm or dissection. The major venous
structures are patent. No mesenteric or retroperitoneal mass or
adenopathy.

Other: Small amount of free pelvic fluid. The bladder, prostate
gland and seminal vesicles appear normal. No inguinal mass or
adenopathy.

Musculoskeletal: No significant bony findings.
IMPRESSION: 1. Acute inflammatory process centered around the inferior margin of
the proximal transverse colon and an adjacent loop of jejunum. Focal
wall thickening of the colon is likely the epicenter and could be
due to a metastasis that has hemorrhaged. Diverticulitis is felt to
be unlikely. The adjacent small bilobed also appears normal and
there could be a metastasis in this area also. Colonoscopy may be
helpful for diagnostic purposes. Upper Endoscopy may also be helpful
as there are lesions in the third portion the duodenum.
2. Diffuse small bowel lesions suspicious for diffuse metastasis. In
retrospect I think these were positive on the PET scan.
[DATE] cm enhancing right renal mass most consistent with renal cell
cancer.
4. Small bilateral adrenal gland lesions. The right lesion was
hypermetabolic on the recent PET-CT. The left lesion is a benign
adenoma.
These results were called by telephone at the time of interpretation
on 12/25/2015 at [DATE] to Dr. MARIA DOLORES TANG , who verbally
acknowledged these results.
